# Patient Record
Sex: Female | Born: 1960
Health system: Southern US, Academic
[De-identification: ages and names within clinical notes are randomized; demographics above are authoritative.]

## PROBLEM LIST (undated history)

## (undated) ENCOUNTER — Encounter

## (undated) ENCOUNTER — Encounter: Attending: Gynecologic Oncology | Primary: Gynecologic Oncology

## (undated) ENCOUNTER — Ambulatory Visit: Payer: PRIVATE HEALTH INSURANCE

## (undated) ENCOUNTER — Encounter: Attending: "Endocrinology | Primary: "Endocrinology

## (undated) ENCOUNTER — Telehealth

## (undated) ENCOUNTER — Non-Acute Institutional Stay: Payer: PRIVATE HEALTH INSURANCE

## (undated) ENCOUNTER — Encounter: Attending: Adult Health | Primary: Adult Health

## (undated) ENCOUNTER — Encounter: Payer: PRIVATE HEALTH INSURANCE | Attending: Gynecologic Oncology | Primary: Gynecologic Oncology

## (undated) ENCOUNTER — Ambulatory Visit: Payer: PRIVATE HEALTH INSURANCE | Attending: Gynecologic Oncology | Primary: Gynecologic Oncology

## (undated) ENCOUNTER — Encounter
Attending: Student in an Organized Health Care Education/Training Program | Primary: Student in an Organized Health Care Education/Training Program

## (undated) ENCOUNTER — Ambulatory Visit

## (undated) ENCOUNTER — Telehealth
Attending: Student in an Organized Health Care Education/Training Program | Primary: Student in an Organized Health Care Education/Training Program

## (undated) DIAGNOSIS — C569 Malignant neoplasm of unspecified ovary: Secondary | ICD-10-CM

## (undated) DIAGNOSIS — J45901 Unspecified asthma with (acute) exacerbation: Secondary | ICD-10-CM

## (undated) DIAGNOSIS — G43909 Migraine, unspecified, not intractable, without status migrainosus: Secondary | ICD-10-CM

## (undated) HISTORY — PX: LIPOMA EXCISION: SHX5283

## (undated) HISTORY — DX: Unspecified asthma with (acute) exacerbation: J45.901

## (undated) HISTORY — DX: Migraine, unspecified, not intractable, without status migrainosus: G43.909

## (undated) HISTORY — PX: ABDOMINAL HYSTERECTOMY: SHX81

---
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
## 1998-11-20 ENCOUNTER — Other Ambulatory Visit: Admission: RE | Admit: 1998-11-20 | Discharge: 1998-11-20 | Payer: Self-pay | Admitting: Obstetrics and Gynecology

## 1999-12-31 ENCOUNTER — Other Ambulatory Visit: Admission: RE | Admit: 1999-12-31 | Discharge: 1999-12-31 | Payer: Self-pay | Admitting: *Deleted

## 2001-09-08 ENCOUNTER — Encounter: Payer: Self-pay | Admitting: Family Medicine

## 2001-09-08 ENCOUNTER — Ambulatory Visit (HOSPITAL_COMMUNITY): Admission: RE | Admit: 2001-09-08 | Discharge: 2001-09-08 | Payer: Self-pay | Admitting: Family Medicine

## 2003-02-09 ENCOUNTER — Encounter: Payer: Self-pay | Admitting: *Deleted

## 2003-02-09 ENCOUNTER — Ambulatory Visit (HOSPITAL_COMMUNITY): Admission: RE | Admit: 2003-02-09 | Discharge: 2003-02-09 | Payer: Self-pay | Admitting: *Deleted

## 2004-04-11 ENCOUNTER — Ambulatory Visit (HOSPITAL_COMMUNITY): Admission: RE | Admit: 2004-04-11 | Discharge: 2004-04-11 | Payer: Self-pay | Admitting: Family Medicine

## 2006-09-09 ENCOUNTER — Ambulatory Visit (HOSPITAL_COMMUNITY): Admission: RE | Admit: 2006-09-09 | Discharge: 2006-09-09 | Payer: Self-pay | Admitting: Family Medicine

## 2006-09-21 ENCOUNTER — Encounter: Admission: RE | Admit: 2006-09-21 | Discharge: 2006-09-21 | Payer: Self-pay | Admitting: Family Medicine

## 2008-03-14 ENCOUNTER — Ambulatory Visit (HOSPITAL_COMMUNITY): Admission: RE | Admit: 2008-03-14 | Discharge: 2008-03-14 | Payer: Self-pay | Admitting: Internal Medicine

## 2009-09-25 ENCOUNTER — Ambulatory Visit (HOSPITAL_COMMUNITY): Admission: RE | Admit: 2009-09-25 | Discharge: 2009-09-25 | Payer: Self-pay | Admitting: Internal Medicine

## 2010-11-07 ENCOUNTER — Ambulatory Visit (HOSPITAL_COMMUNITY)
Admission: RE | Admit: 2010-11-07 | Discharge: 2010-11-07 | Payer: Self-pay | Source: Home / Self Care | Attending: Internal Medicine | Admitting: Internal Medicine

## 2010-11-30 ENCOUNTER — Encounter: Payer: Self-pay | Admitting: Family Medicine

## 2010-11-30 ENCOUNTER — Encounter: Payer: Self-pay | Admitting: Internal Medicine

## 2011-08-19 ENCOUNTER — Other Ambulatory Visit (HOSPITAL_COMMUNITY): Payer: Self-pay | Admitting: Family Medicine

## 2011-08-19 DIAGNOSIS — Z1231 Encounter for screening mammogram for malignant neoplasm of breast: Secondary | ICD-10-CM

## 2011-11-16 ENCOUNTER — Ambulatory Visit (HOSPITAL_COMMUNITY)
Admission: RE | Admit: 2011-11-16 | Discharge: 2011-11-16 | Disposition: A | Discharge status: Home / Self Care | Payer: Managed Care, Other (non HMO) | Source: Ambulatory Visit | Attending: Family Medicine | Admitting: Family Medicine

## 2011-11-16 DIAGNOSIS — Z1231 Encounter for screening mammogram for malignant neoplasm of breast: Secondary | ICD-10-CM | POA: Insufficient documentation

## 2012-02-18 ENCOUNTER — Encounter (INDEPENDENT_AMBULATORY_CARE_PROVIDER_SITE_OTHER): Payer: Self-pay | Admitting: General Surgery

## 2012-02-18 ENCOUNTER — Ambulatory Visit (INDEPENDENT_AMBULATORY_CARE_PROVIDER_SITE_OTHER): Payer: Managed Care, Other (non HMO) | Admitting: General Surgery

## 2012-02-18 VITALS — BP 140/84 | HR 76 | Temp 98.0°F | Resp 16 | Ht 63.0 in | Wt 182.8 lb

## 2012-02-18 DIAGNOSIS — D235 Other benign neoplasm of skin of trunk: Secondary | ICD-10-CM | POA: Insufficient documentation

## 2012-02-18 NOTE — Patient Instructions (Signed)
We will schedule your procedure today.

## 2012-02-18 NOTE — Progress Notes (Addendum)
Patient ID: Mallory Huber, female   DOB: [birth year], 51 y.o.   MRN: [id number]  Chief Complaint  Patient presents with  . Lipoma    est pt- eval lipoma on back    HPI Mallory Huber is a 51 y.o. female.   HPI  She is self referred for evaluation of a soft tissue mass in the left upper back that is painful.  She has known about the mass for years but recently it has become painful-dull pain.  She is here to have it evaluated.  No trauma to the area.  No bleeding or infection.  She was told by one of her PCPs in the past that it may be a lipoma.  It has not grown to her knowledge.  Past Medical History  Diagnosis Date  . Lipoma     on back    Past Surgical History  Procedure Date  . Cesarean section 86, 89, 90    History reviewed. No pertinent family history.  Social History History  Substance Use Topics  . Smoking status: Never Smoker   . Smokeless tobacco: Not on file  . Alcohol Use: Yes    No Known Allergies  Current Outpatient Prescriptions  Medication Sig Dispense Refill  . Calcium Carbonate-Vitamin D (CALCIUM + D PO) Take by mouth daily.      . fluticasone (FLONASE) 50 MCG/ACT nasal spray Ad lib.      Stephanie Acre (GLUCOSAMINE MSM COMPLEX PO) Take by mouth daily.      . Multiple Vitamin (MULTIVITAMIN) capsule Take 1 capsule by mouth daily.        Review of Systems Review of Systems  Constitutional: Negative.   Respiratory: Negative.   Cardiovascular: Negative.   Musculoskeletal:       Pain in right scapular region where soft tissue mass is.    Blood pressure 140/84, pulse 76, temperature 98 F (36.7 C), temperature source Temporal, resp. rate 16, height 5\' 3"  (1.6 m), weight 182 lb 12.8 oz (82.918 kg).  Physical Exam Physical Exam  Constitutional: She appears well-developed and well-nourished. No distress.  Musculoskeletal:       5 cm by 5 cm deep soft tissue mass in right scapular area.  Good ROM of RUE.    Data  Reviewed None  Assessment    Symptomatic soft tissue mass of the left back of unknown character.    Plan    Excision of the mass.  The procedure, aftercare, and risks were discussed with there.  The risks include but are not limited to bleeding, infection, wound healing problems, anesthesia.  She seems to understand and agrees with the plan       Irlene Crudup J 02/18/2012, 1:42 PM

## 2012-04-29 DIAGNOSIS — D1739 Benign lipomatous neoplasm of skin and subcutaneous tissue of other sites: Secondary | ICD-10-CM

## 2012-11-29 ENCOUNTER — Other Ambulatory Visit (HOSPITAL_COMMUNITY): Payer: Self-pay | Admitting: Family Medicine

## 2012-11-29 DIAGNOSIS — Z1231 Encounter for screening mammogram for malignant neoplasm of breast: Secondary | ICD-10-CM

## 2012-12-12 ENCOUNTER — Ambulatory Visit (HOSPITAL_COMMUNITY)
Admission: RE | Admit: 2012-12-12 | Discharge: 2012-12-12 | Disposition: A | Discharge status: Home / Self Care | Payer: Managed Care, Other (non HMO) | Source: Ambulatory Visit | Attending: Family Medicine | Admitting: Family Medicine

## 2012-12-12 DIAGNOSIS — Z1231 Encounter for screening mammogram for malignant neoplasm of breast: Secondary | ICD-10-CM | POA: Insufficient documentation

## 2013-06-20 ENCOUNTER — Telehealth: Payer: Self-pay | Admitting: *Deleted

## 2013-06-27 NOTE — Telephone Encounter (Signed)
New pt appt.

## 2013-08-14 ENCOUNTER — Ambulatory Visit (INDEPENDENT_AMBULATORY_CARE_PROVIDER_SITE_OTHER): Payer: Managed Care, Other (non HMO) | Admitting: Internal Medicine

## 2013-08-14 ENCOUNTER — Encounter: Payer: Self-pay | Admitting: Internal Medicine

## 2013-08-14 ENCOUNTER — Encounter: Payer: Self-pay | Admitting: *Deleted

## 2013-08-14 VITALS — BP 119/72 | HR 66 | Temp 97.2°F | Resp 18 | Ht 63.0 in | Wt 159.0 lb

## 2013-08-14 DIAGNOSIS — E559 Vitamin D deficiency, unspecified: Secondary | ICD-10-CM

## 2013-08-14 DIAGNOSIS — Z8619 Personal history of other infectious and parasitic diseases: Secondary | ICD-10-CM

## 2013-08-14 DIAGNOSIS — M899 Disorder of bone, unspecified: Secondary | ICD-10-CM

## 2013-08-14 DIAGNOSIS — Z9189 Other specified personal risk factors, not elsewhere classified: Secondary | ICD-10-CM

## 2013-08-14 DIAGNOSIS — K8020 Calculus of gallbladder without cholecystitis without obstruction: Secondary | ICD-10-CM | POA: Insufficient documentation

## 2013-08-14 DIAGNOSIS — Z9289 Personal history of other medical treatment: Secondary | ICD-10-CM

## 2013-08-14 DIAGNOSIS — J309 Allergic rhinitis, unspecified: Secondary | ICD-10-CM

## 2013-08-14 DIAGNOSIS — J302 Other seasonal allergic rhinitis: Secondary | ICD-10-CM

## 2013-08-14 DIAGNOSIS — J45990 Exercise induced bronchospasm: Secondary | ICD-10-CM

## 2013-08-14 DIAGNOSIS — Z8669 Personal history of other diseases of the nervous system and sense organs: Secondary | ICD-10-CM

## 2013-08-14 DIAGNOSIS — M8580 Other specified disorders of bone density and structure, unspecified site: Secondary | ICD-10-CM

## 2013-08-14 LAB — CBC WITH DIFFERENTIAL/PLATELET
Basophils Relative: 1 % (ref 0–1)
Eosinophils Absolute: 0.2 10*3/uL (ref 0.0–0.7)
HCT: 33.1 % — ABNORMAL LOW (ref 36.0–46.0)
Hemoglobin: 11.2 g/dL — ABNORMAL LOW (ref 12.0–15.0)
MCH: 30.4 pg (ref 26.0–34.0)
MCHC: 33.8 g/dL (ref 30.0–36.0)
MCV: 89.7 fL (ref 78.0–100.0)
Monocytes Absolute: 0.3 10*3/uL (ref 0.1–1.0)
Monocytes Relative: 6 % (ref 3–12)

## 2013-08-14 LAB — LIPID PANEL
Cholesterol: 152 mg/dL (ref 0–200)
HDL: 65 mg/dL (ref >39)
LDL Cholesterol: 78 mg/dL (ref 0–99)
Triglycerides: 46 mg/dL (ref <150)
VLDL: 9 mg/dL (ref 0–40)

## 2013-08-14 LAB — COMPREHENSIVE METABOLIC PANEL
Albumin: 4.2 g/dL (ref 3.5–5.2)
Alkaline Phosphatase: 54 U/L (ref 39–117)
BUN: 15 mg/dL (ref 6–23)
Glucose, Bld: 80 mg/dL (ref 70–99)
Total Bilirubin: 0.6 mg/dL (ref 0.3–1.2)

## 2013-08-14 MED ORDER — FLUTICASONE PROPIONATE 50 MCG/ACT NA SUSP: NASAL | Status: DC

## 2013-08-14 NOTE — Patient Instructions (Addendum)
Activate My chart  Schedule CPE

## 2013-08-14 NOTE — Progress Notes (Signed)
Subjective:    Patient ID: Mallory Huber, female    DOB: 09-21-1961, 52 y.o.   MRN: [id number]  HPI Gindlesperger is a new pt here for first visit establish care.    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 And prior menses 4 months previous.     Overall doing well.  Had some positional dizziness 4 weeks ago but now resolved.  "Felt like bed spinning"   When she would lie down.    See scanned labs from 08/29/2012  She is using fluticasone for her allergies  No Known Allergies Past Medical History  Diagnosis Date  . Lipoma     on back   Past Surgical History  Procedure Laterality Date  . Cesarean section  86, 89, 90   History   Social History  . Marital Status: Married    Spouse Name: N/A    Number of Children: N/A  . Years of Education: N/A   Occupational History  . Not on file.   Social History Main Topics  . Smoking status: Never Smoker   . Smokeless tobacco: Not on file  . Alcohol Use: Yes  . Drug Use: No  . Sexual Activity:    Other Topics Concern  . Not on file   Social History Narrative  . No narrative on file   No family history on file. Patient Active Problem List   Diagnosis Date Noted  . Benign neoplasm of skin of trunk, except scrotum-right upper back 02/18/2012   Current Outpatient Prescriptions on File Prior to Visit  Medication Sig Dispense Refill  . Calcium Carbonate-Vitamin D (CALCIUM + D PO) Take by mouth daily.      . fluticasone (FLONASE) 50 MCG/ACT nasal spray Ad lib.      Stephanie Acre (GLUCOSAMINE MSM COMPLEX PO) Take by mouth daily.      . Multiple Vitamin (MULTIVITAMIN) capsule Take 1 capsule by mouth daily.       No current facility-administered medications on file prior to visit.       Review of Systems See HPI     Objective:   Physical Exam  Physical Exam  Nursing note and vitals reviewed.  Constitutional: She is oriented to person, place, and time. She appears well-developed and well-nourished.  HENT:  Head: Normocephalic and atraumatic.  Cardiovascular: Normal rate and regular rhythm. Exam reveals no gallop and no friction rub.  No murmur heard.  Pulmonary/Chest: Breath sounds normal. She has no wheezes. She has no rales.  Neurological: She is alert and oriented to person, place, and time.  CNII-xii intact Motor  5/5 Ue and LE REflexes  2+ symmetric Cerebellar  Intact FTN Sensor intact to touch Skin: Skin is warm and dry.  Psychiatric: She has a normal mood and affect. Her behavior is normal.            Assessment & Plan:  Allergic rhinitis  Will re-order flonase  Dizziness:    Clinical picture consistant with BPV   Neurologic exam non focal.  Discussed vestibular rehab and Epley maneuver.   If recurs she is to see me in office.  She does  Not wish to see rehab at this point as sympotms have resolved.    Vitamin D deficiency will obtain today  Exercise induced asthma  Continue prn albuterol  History of migraines  Prn NSAId for ne  cholelihiasis

## 2013-08-16 ENCOUNTER — Encounter: Payer: Self-pay | Admitting: Internal Medicine

## 2013-08-16 ENCOUNTER — Telehealth: Payer: Self-pay | Admitting: Internal Medicine

## 2013-08-16 MED ORDER — INTEGRA 62.5-62.5-40-3 MG PO CAPS: ORAL_CAPSULE | ORAL | Status: DC

## 2013-08-16 NOTE — Telephone Encounter (Signed)
Left voicemail at home and work for pt to call office regarding lab results

## 2013-08-16 NOTE — Telephone Encounter (Signed)
Spoke with pt.  And informed of slight anemia and slightly eleveated liver enzyme  Will take Integra daily and she will see me in December for repeat liver enzymes

## 2013-08-24 ENCOUNTER — Encounter: Payer: Self-pay | Admitting: Internal Medicine

## 2013-09-14 ENCOUNTER — Other Ambulatory Visit: Payer: Self-pay | Admitting: Internal Medicine

## 2013-09-18 ENCOUNTER — Encounter: Payer: Self-pay | Admitting: Internal Medicine

## 2013-09-20 ENCOUNTER — Other Ambulatory Visit: Payer: Self-pay | Admitting: *Deleted

## 2013-09-20 ENCOUNTER — Telehealth: Payer: Self-pay | Admitting: *Deleted

## 2013-09-20 NOTE — Telephone Encounter (Signed)
Pt needs these medications refilled and would like available refills at pharmacy.  [facility] on [address]  fluticasone (FLONASE) 50 MCG/ACT nasal spray   acyclovir (ZOVIRAX) 400 MG tablet  albuterol (PROVENTIL HFA;VENTOLIN HFA) 108 (90 BASE) MCG/ACT inhaler

## 2013-09-20 NOTE — Telephone Encounter (Signed)
Returned pt call regarding left hip pain pt wanted referral to sports medicine gave her # to Dr Pearletha Forge

## 2013-09-22 ENCOUNTER — Ambulatory Visit (INDEPENDENT_AMBULATORY_CARE_PROVIDER_SITE_OTHER): Payer: Managed Care, Other (non HMO) | Admitting: Family Medicine

## 2013-09-22 ENCOUNTER — Other Ambulatory Visit: Payer: Self-pay | Admitting: *Deleted

## 2013-09-22 VITALS — BP 167/95 | HR 67 | Ht 63.0 in | Wt 153.0 lb

## 2013-09-22 DIAGNOSIS — M25552 Pain in left hip: Secondary | ICD-10-CM

## 2013-09-22 DIAGNOSIS — D649 Anemia, unspecified: Secondary | ICD-10-CM

## 2013-09-22 DIAGNOSIS — M25559 Pain in unspecified hip: Secondary | ICD-10-CM

## 2013-09-22 DIAGNOSIS — R748 Abnormal levels of other serum enzymes: Secondary | ICD-10-CM

## 2013-09-22 MED ORDER — FLUTICASONE PROPIONATE 50 MCG/ACT NA SUSP: NASAL | Status: DC

## 2013-09-22 MED ORDER — ALBUTEROL SULFATE HFA 108 (90 BASE) MCG/ACT IN AERS: 2.0000 | INHALATION_SPRAY | Freq: Four times a day (QID) | RESPIRATORY_TRACT | Status: DC | PRN

## 2013-09-22 MED ORDER — ACYCLOVIR 400 MG PO TABS: 400.0000 mg | ORAL_TABLET | Freq: Two times a day (BID) | ORAL | Status: DC

## 2013-09-22 NOTE — Telephone Encounter (Signed)
Refill per Dr Constance Goltz VO sent to [facility] and called in

## 2013-09-22 NOTE — Patient Instructions (Signed)
You have elements of both piriformis syndrome and proximal hamstring tendinopathy. Start home exercises 3 sets of 10 once a day for next 6 weeks. Stretches - pick 2-3 of these, hold for 20-30 seconds and repeat 3 times once a day. Ok to continue running as long as not limping and pain is not more than a 3 on a scale of 1-10. Consider compression shorts. Ice or heat 15 minutes at a time 3-4 times a day. Tennis ball massage. Consider formal physical therapy if not improving as expected. Follow up with me in 5 weeks for reevaluation.

## 2013-09-26 ENCOUNTER — Encounter: Payer: Self-pay | Admitting: Family Medicine

## 2013-09-26 DIAGNOSIS — M25552 Pain in left hip: Secondary | ICD-10-CM | POA: Insufficient documentation

## 2013-09-26 NOTE — Assessment & Plan Note (Signed)
consistent with piriformis syndrome, proximal hamstring tendinopathy.  Start home exercises, stretches which were given today.  Heat as needed, compression shorts, tennis ball massage.  Consider formal physical therapy if not improving as expected.  F/u in 5 weeks.

## 2013-09-26 NOTE — Progress Notes (Signed)
Patient ID: Mallory Huber, female   DOB: [birth year], 52 y.o.   MRN: [id number]  PCP: Levon Hedger, MD  Subjective:   HPI: Patient is a 52 y.o. female here for left hip pain.  Patient deneies known injury. States she sits a lot at work. Pain going on for about 2 months but past 2 weeks have been worse. Has posterior left buttock/hip pain that radiates down leg. Not worse with or after running. No numbness, tingling. Able to run half marathon recently. No bowel/bladder dysfunction  Past Medical History  Diagnosis Date  . Lipoma     on back  . Migraine   . Asthma with exacerbation     Current Outpatient Prescriptions on File Prior to Visit  Medication Sig Dispense Refill  . Calcium Carbonate-Vitamin D (CALCIUM + D PO) Take by mouth daily.      . Fe Fum-FePoly-Vit C-Vit B3 (INTEGRA) 62.5-62.5-40-3 MG CAPS Take one daily  30 capsule  3  . fexofenadine (ALLEGRA) 30 MG tablet Take 30 mg by mouth daily.      . fish oil-omega-3 fatty acids 1000 MG capsule Take 1,200 mg by mouth daily.      Stephanie Acre (GLUCOSAMINE MSM COMPLEX PO) Take by mouth daily.      . Melatonin 5 MG TABS Take 5 mg by mouth at bedtime as needed.      . Multiple Vitamin (MULTIVITAMIN) capsule Take 1 capsule by mouth daily.      . Vitamin D, Ergocalciferol, (DRISDOL) 50000 UNITS CAPS capsule Take 50,000 Units by mouth every 7 (seven) days.       No current facility-administered medications on file prior to visit.    Past Surgical History  Procedure Laterality Date  . Cesarean section  86, 89, 90  . Lipoma excision      No Known Allergies  History   Social History  . Marital Status: Married    Spouse Name: N/A    Number of Children: N/A  . Years of Education: N/A   Occupational History  . Not on file.   Social History Main Topics  . Smoking status: Never Smoker   . Smokeless tobacco: Never Used  . Alcohol Use: Yes  . Drug Use: No  . Sexual Activity: Yes    Birth  Control/ Protection: Surgical   Other Topics Concern  . Not on file   Social History Narrative  . No narrative on file    Family History  Problem Relation Age of Onset  . Hypertension Mother   . Hypertension Father   . Cancer Maternal Aunt     breast  . Hyperlipidemia Neg Hx   . Heart attack Neg Hx   . Diabetes Neg Hx   . Sudden death Neg Hx     BP 167/95  Pulse 67  Ht 5\' 3"  (1.6 m)  Wt 153 lb (69.4 kg)  BMI 27.11 kg/m2  Review of Systems: See HPI above.    Objective:  Physical Exam:  Gen: NAD  Back/L hip: No gross deformity, scoliosis. TTP mildly piriformis and proximal hamstring.  No midline or bony TTP. FROM back and hip without pain on passive ROM. Strength LEs 5/5 all muscle groups.  Minimal pain with hamstring curl at 90 degrees. 2+ MSRs in patellar and achilles tendons, equal bilaterally. Negative SLRs. Sensation intact to light touch bilaterally. Negative logroll bilateral hips Mild positive piriformis left.  Negative fabers and right piriformis stretches.    Assessment & Plan:  1. Left hip  pain - consistent with piriformis syndrome, proximal hamstring tendinopathy.  Start home exercises, stretches which were given today.  Heat as needed, compression shorts, tennis ball massage.  Consider formal physical therapy if not improving as expected.  F/u in 5 weeks.

## 2013-10-16 ENCOUNTER — Ambulatory Visit: Payer: Managed Care, Other (non HMO) | Admitting: Internal Medicine

## 2013-10-18 ENCOUNTER — Ambulatory Visit: Payer: Managed Care, Other (non HMO) | Admitting: Internal Medicine

## 2013-10-19 ENCOUNTER — Ambulatory Visit: Payer: Managed Care, Other (non HMO) | Admitting: Family Medicine

## 2013-10-30 ENCOUNTER — Emergency Department (HOSPITAL_BASED_OUTPATIENT_CLINIC_OR_DEPARTMENT_OTHER)
Admission: EM | Admit: 2013-10-30 | Discharge: 2013-10-30 | Disposition: A | Discharge status: Home / Self Care | Payer: Managed Care, Other (non HMO) | Attending: Emergency Medicine | Admitting: Emergency Medicine

## 2013-10-30 ENCOUNTER — Encounter (HOSPITAL_BASED_OUTPATIENT_CLINIC_OR_DEPARTMENT_OTHER): Payer: Self-pay | Admitting: Emergency Medicine

## 2013-10-30 ENCOUNTER — Emergency Department (HOSPITAL_BASED_OUTPATIENT_CLINIC_OR_DEPARTMENT_OTHER): Payer: Managed Care, Other (non HMO)

## 2013-10-30 DIAGNOSIS — Z8679 Personal history of other diseases of the circulatory system: Secondary | ICD-10-CM | POA: Insufficient documentation

## 2013-10-30 DIAGNOSIS — Z79899 Other long term (current) drug therapy: Secondary | ICD-10-CM | POA: Insufficient documentation

## 2013-10-30 DIAGNOSIS — Z862 Personal history of diseases of the blood and blood-forming organs and certain disorders involving the immune mechanism: Secondary | ICD-10-CM | POA: Insufficient documentation

## 2013-10-30 DIAGNOSIS — R142 Eructation: Secondary | ICD-10-CM | POA: Insufficient documentation

## 2013-10-30 DIAGNOSIS — J45909 Unspecified asthma, uncomplicated: Secondary | ICD-10-CM | POA: Insufficient documentation

## 2013-10-30 DIAGNOSIS — IMO0002 Reserved for concepts with insufficient information to code with codable children: Secondary | ICD-10-CM | POA: Insufficient documentation

## 2013-10-30 DIAGNOSIS — R141 Gas pain: Secondary | ICD-10-CM | POA: Insufficient documentation

## 2013-10-30 DIAGNOSIS — K8020 Calculus of gallbladder without cholecystitis without obstruction: Secondary | ICD-10-CM | POA: Insufficient documentation

## 2013-10-30 DIAGNOSIS — Z8639 Personal history of other endocrine, nutritional and metabolic disease: Secondary | ICD-10-CM | POA: Insufficient documentation

## 2013-10-30 LAB — COMPREHENSIVE METABOLIC PANEL
AST: 37 U/L (ref 0–37)
BUN: 10 mg/dL (ref 6–23)
CO2: 27 mEq/L (ref 19–32)
Calcium: 8.9 mg/dL (ref 8.4–10.5)
Chloride: 101 mEq/L (ref 96–112)
Creatinine, Ser: 0.7 mg/dL (ref 0.50–1.10)
GFR calc Af Amer: >90 mL/min (ref >90)
GFR calc non Af Amer: >90 mL/min (ref >90)
Glucose, Bld: 95 mg/dL (ref 70–99)
Total Bilirubin: 0.3 mg/dL (ref 0.3–1.2)

## 2013-10-30 LAB — CBC WITH DIFFERENTIAL/PLATELET
Basophils Absolute: 0 10*3/uL (ref 0.0–0.1)
Eosinophils Relative: 2 % (ref 0–5)
HCT: 37.3 % (ref 36.0–46.0)
Hemoglobin: 12.3 g/dL (ref 12.0–15.0)
Lymphocytes Relative: 15 % (ref 12–46)
MCV: 94.2 fL (ref 78.0–100.0)
Monocytes Absolute: 0.6 10*3/uL (ref 0.1–1.0)
Monocytes Relative: 7 % (ref 3–12)
RDW: 12.3 % (ref 11.5–15.5)
WBC: 9.2 10*3/uL (ref 4.0–10.5)

## 2013-10-30 LAB — URINE MICROSCOPIC-ADD ON

## 2013-10-30 LAB — URINALYSIS, ROUTINE W REFLEX MICROSCOPIC
Ketones, ur: NEGATIVE mg/dL
Nitrite: NEGATIVE
Protein, ur: NEGATIVE mg/dL
Urobilinogen, UA: 0.2 mg/dL (ref 0.0–1.0)

## 2013-10-30 LAB — LIPASE, BLOOD: Lipase: 21 U/L (ref 11–59)

## 2013-10-30 NOTE — ED Notes (Signed)
Pt c/o right upper abd pain with bloating x 3 days. LAst BM x 3 days ago

## 2013-10-30 NOTE — ED Provider Notes (Addendum)
CSN: [id number]     Arrival date & time 10/30/13  1507 History   First MD Initiated Contact with Patient 10/30/13 1536  This chart was scribed for Gwyneth Sprout, MD by Valera Castle, ED Scribe. This patient was seen in room MH04/MH04 and the patient's care was started at 3:37 PM.     Chief Complaint  Patient presents with  . Abdominal Pain   The history is provided by the patient. No language interpreter was used.   HPI Comments: Mallory Huber is a 52 y.o. female who presents to the Emergency Department complaining of sudden, moderate, intermittent, upper, right abdominal pain, that intermittently radiates to her right back, with associated distention, onset 3 days ago. She denies any current pain, just distention. She reports h/o abdomen scan and [place], which showed a blockage of her cystic duct. Pt states her abdominal pain had subsided soon after the imaging, so her doctor and her decided not to remove her gallbladder/duct. She states her current abdominal pain seems similar to her h/o abdominal pain before having the scan and [place]. She reports having successful bowel movements recently. She denies having eating today due to the pain, and only eating a little yesterday. She reports taking medication for her distention, without relief. She denies emesis, left abdominal pain, and any other associated symptoms.    PCP Levon Hedger, MD  Past Medical History  Diagnosis Date  . Lipoma     on back  . Migraine   . Asthma with exacerbation    Past Surgical History  Procedure Laterality Date  . Cesarean section  86, 89, 90  . Lipoma excision     Family History  Problem Relation Age of Onset  . Hypertension Mother   . Hypertension Father   . Cancer Maternal Aunt     breast  . Hyperlipidemia Neg Hx   . Heart attack Neg Hx   . Diabetes Neg Hx   . Sudden death Neg Hx    History  Substance Use Topics  . Smoking status: Never Smoker   . Smokeless tobacco: Never Used  . Alcohol  Use: Yes   OB History   Grav Para Term Preterm Abortions TAB SAB Ect Mult Living   4         3     Review of Systems A complete 10 system review of systems was obtained and all systems are negative except as noted in the HPI and PMH.   Allergies  Review of patient's allergies indicates no known allergies.  Home Medications   Current Outpatient Rx  Name  Route  Sig  Dispense  Refill  . acyclovir (ZOVIRAX) 400 MG tablet   Oral   Take 1 tablet (400 mg total) by mouth 2 (two) times daily. s needed for cold sores   60 tablet   3   . albuterol (PROVENTIL HFA;VENTOLIN HFA) 108 (90 BASE) MCG/ACT inhaler   Inhalation   Inhale 2 puffs into the lungs every 6 (six) hours as needed for wheezing.   1 Inhaler   1   . Calcium Carbonate-Vitamin D (CALCIUM + D PO)   Oral   Take by mouth daily.         . Fe Fum-FePoly-Vit C-Vit B3 (INTEGRA) 62.5-62.5-40-3 MG CAPS      Take one daily   30 capsule   3   . fexofenadine (ALLEGRA) 30 MG tablet   Oral   Take 30 mg by mouth daily.         Marland Kitchen  fish oil-omega-3 fatty acids 1000 MG capsule   Oral   Take 1,200 mg by mouth daily.         . fluticasone (FLONASE) 50 MCG/ACT nasal spray      Use one spray in each nostril daily for 2 weeks   16 g   0   . Glucos-MSM-C-Mn-Ginger-Willow (GLUCOSAMINE MSM COMPLEX PO)   Oral   Take by mouth daily.         . Melatonin 5 MG TABS   Oral   Take 5 mg by mouth at bedtime as needed.         . Multiple Vitamin (MULTIVITAMIN) capsule   Oral   Take 1 capsule by mouth daily.         . Vitamin D, Ergocalciferol, (DRISDOL) 50000 UNITS CAPS capsule   Oral   Take 50,000 Units by mouth every 7 (seven) days.          BP 147/80  Pulse 82  Temp(Src) 98.6 F (37 C)  Resp 16  Ht 5\' 3"  (1.6 m)  Wt 151 lb (68.493 kg)  BMI 26.76 kg/m2  SpO2 99%  LMP 10/23/2013  Physical Exam  Nursing note and vitals reviewed. Constitutional: She is oriented to person, place, and time. She appears  well-developed and well-nourished. No distress.  HENT:  Head: Normocephalic and atraumatic.  Eyes: EOM are normal.  Neck: Neck supple. No tracheal deviation present.  Cardiovascular: Normal rate.   Pulmonary/Chest: Effort normal. No respiratory distress.  Abdominal: Soft. There is tenderness in the right upper quadrant. There is positive Murphy's sign.  Musculoskeletal: Normal range of motion.  Neurological: She is alert and oriented to person, place, and time.  Skin: Skin is warm and dry.  Psychiatric: She has a normal mood and affect. Her behavior is normal.    ED Course  Procedures (including critical care time)  DIAGNOSTIC STUDIES: Oxygen Saturation is 99% on room air, normal by my interpretation.    COORDINATION OF CARE: 3:41 PM-Discussed treatment plan which includes [place], blood work with pt at bedside and pt agreed to plan.   Labs Review Labs Reviewed  CBC WITH DIFFERENTIAL - Abnormal; Notable for the following:    Platelets 408 (*)    All other components within normal limits  COMPREHENSIVE METABOLIC PANEL - Abnormal; Notable for the following:    ALT 52 (*)    All other components within normal limits  URINALYSIS, ROUTINE W REFLEX MICROSCOPIC - Abnormal; Notable for the following:    Leukocytes, UA SMALL (*)    All other components within normal limits  LIPASE, BLOOD  URINE MICROSCOPIC-ADD ON   Imaging Review US Abdomen Complete  10/30/2013   CLINICAL DATA:  Right upper quadrant pain  EXAM: ULTRASOUND ABDOMEN COMPLETE  COMPARISON:  None.  FINDINGS: Gallbladder:  Multiple gallstones are identified. Mild wall thickening is noted at 4 mm.  Common bile duct:  Diameter: 6 mm.  Liver:  No focal lesion is identified. Diffuse increased echogenicity is seen.  IVC:  No abnormality visualized.  Pancreas:  Visualized portion unremarkable.  Spleen:  Size and appearance within normal limits.  Right Kidney:  Length: 10.9 cm in length. Echogenicity is within normal limits. No mass  lesion or hydronephrosis is noted. .  Left Kidney:  Length: 10.7 cm in length. Echogenicity within normal limits. No mass or hydronephrosis visualized.  Abdominal aorta:  No aneurysm visualized.  Other findings:  Mild ascites is noted throughout the abdomen.  IMPRESSION: Mild ascites.  Cholelithiasis with mild wall thickening. The wall thickening may be related to the ascites however.   Electronically Signed   By: Alcide Clever M.D.   On: 10/30/2013 16:24    EKG Interpretation   None       MDM   1. Cholelithiases     Patient with a prior history of gallbladder pathology proven on HIDA scan approximately 2 years ago. However in the last 3-4 days patient has had worsening upper abdominal distention and right upper quadrant pain.  Nausea without vomiting and no fever. She is having normal bowel movements but states that every time she eats the pain worsens. When she was seeing Dr. Abbey Chatters during the testing 2 years ago her sx resolved and they decided to not remove the gallbladder however now the pain returned.  On exam she has RUQ pain and positive murphy's sign.  Nontoxic appearing and does not want anything for pain at this time.  CBC, CMP, lipase, UA pending.  [place] pending.  5:04 PM Labs wnl.  UA with cholelithiasis without cholecysitis at this time.  Will f/u with general surgery.  I personally performed the services described in this documentation, which was scribed in my presence.  The recorded information has been reviewed and considered.    Gwyneth Sprout, MD 10/30/13 1705  Gwyneth Sprout, MD 10/30/13 [phone]

## 2013-11-01 ENCOUNTER — Encounter (HOSPITAL_COMMUNITY): Payer: Self-pay | Admitting: Emergency Medicine

## 2013-11-01 ENCOUNTER — Emergency Department (HOSPITAL_COMMUNITY): Payer: Managed Care, Other (non HMO)

## 2013-11-01 ENCOUNTER — Emergency Department (HOSPITAL_COMMUNITY)
Admission: EM | Admit: 2013-11-01 | Discharge: 2013-11-01 | Disposition: A | Discharge status: Home / Self Care | Payer: Managed Care, Other (non HMO) | Attending: Emergency Medicine | Admitting: Emergency Medicine

## 2013-11-01 DIAGNOSIS — Z872 Personal history of diseases of the skin and subcutaneous tissue: Secondary | ICD-10-CM | POA: Insufficient documentation

## 2013-11-01 DIAGNOSIS — K8020 Calculus of gallbladder without cholecystitis without obstruction: Secondary | ICD-10-CM | POA: Insufficient documentation

## 2013-11-01 DIAGNOSIS — R109 Unspecified abdominal pain: Secondary | ICD-10-CM

## 2013-11-01 DIAGNOSIS — R188 Other ascites: Secondary | ICD-10-CM

## 2013-11-01 DIAGNOSIS — J45909 Unspecified asthma, uncomplicated: Secondary | ICD-10-CM | POA: Insufficient documentation

## 2013-11-01 DIAGNOSIS — Z79899 Other long term (current) drug therapy: Secondary | ICD-10-CM | POA: Insufficient documentation

## 2013-11-01 DIAGNOSIS — R1011 Right upper quadrant pain: Secondary | ICD-10-CM

## 2013-11-01 DIAGNOSIS — Z8679 Personal history of other diseases of the circulatory system: Secondary | ICD-10-CM | POA: Insufficient documentation

## 2013-11-01 DIAGNOSIS — IMO0002 Reserved for concepts with insufficient information to code with codable children: Secondary | ICD-10-CM | POA: Insufficient documentation

## 2013-11-01 LAB — URINALYSIS, ROUTINE W REFLEX MICROSCOPIC
Ketones, ur: NEGATIVE mg/dL
Nitrite: NEGATIVE
Specific Gravity, Urine: 1.025 (ref 1.005–1.030)
pH: 5.5 (ref 5.0–8.0)

## 2013-11-01 LAB — COMPREHENSIVE METABOLIC PANEL
Albumin: 3.2 g/dL — ABNORMAL LOW (ref 3.5–5.2)
Alkaline Phosphatase: 68 U/L (ref 39–117)
BUN: 14 mg/dL (ref 6–23)
Calcium: 8.7 mg/dL (ref 8.4–10.5)
Creatinine, Ser: 0.66 mg/dL (ref 0.50–1.10)
GFR calc non Af Amer: >90 mL/min (ref >90)
Glucose, Bld: 97 mg/dL (ref 70–99)
Sodium: 140 mEq/L (ref 135–145)
Total Protein: 6.3 g/dL (ref 6.0–8.3)

## 2013-11-01 LAB — LIPASE, BLOOD: Lipase: 17 U/L (ref 11–59)

## 2013-11-01 LAB — CBC
HCT: 36.6 % (ref 36.0–46.0)
MCHC: 33.6 g/dL (ref 30.0–36.0)
MCV: 93.1 fL (ref 78.0–100.0)
RDW: 12.9 % (ref 11.5–15.5)

## 2013-11-01 LAB — URINE MICROSCOPIC-ADD ON

## 2013-11-01 IMAGING — US US ABDOMEN LIMITED
1 series · 13 of 25 positions shown · non-contrast
Comparison: [DATE]

CLINICAL DATA: Abdominal pain, right upper quadrant pain

EXAM:
US ABDOMEN LIMITED - RIGHT UPPER QUADRANT

[Series 1: us abdomen limited · 0.24mm/px · 13 of 83 slices shown]
[im 1/83]
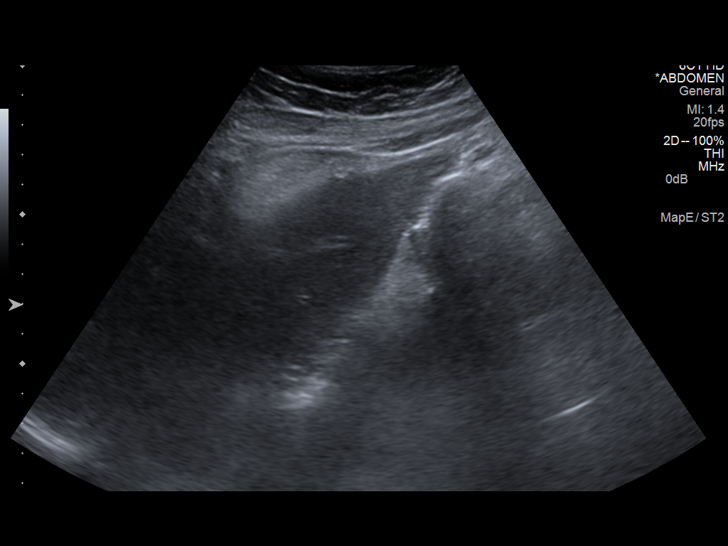
[im 7/83]
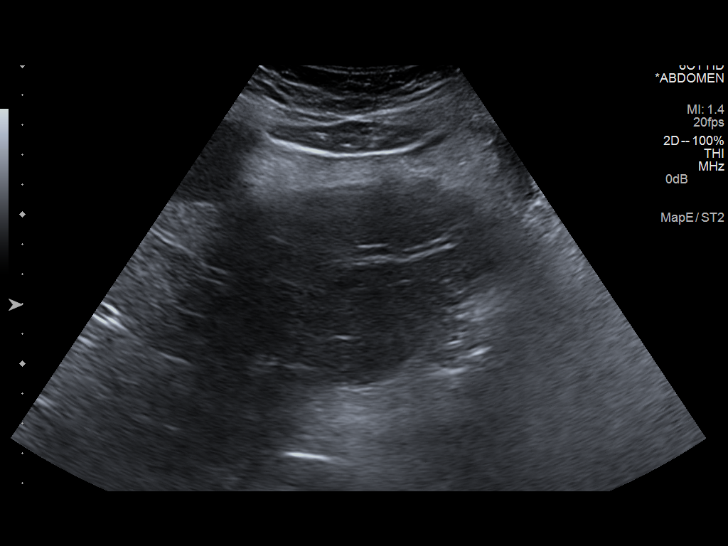
[im 14/83]
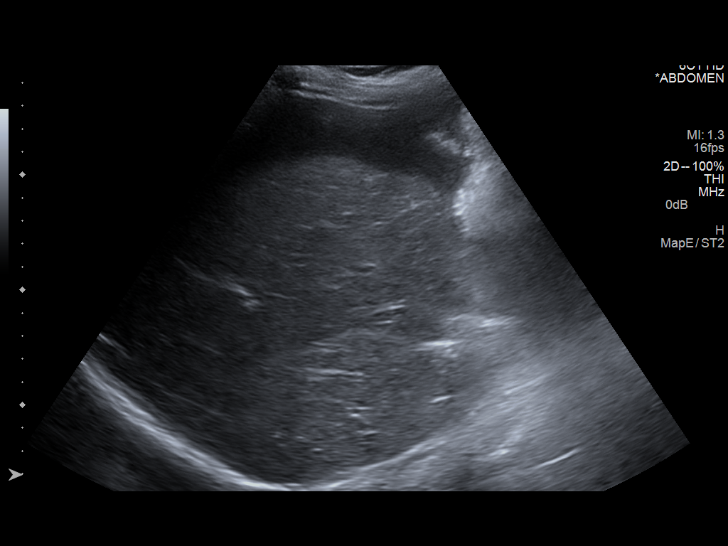
[im 21/83]
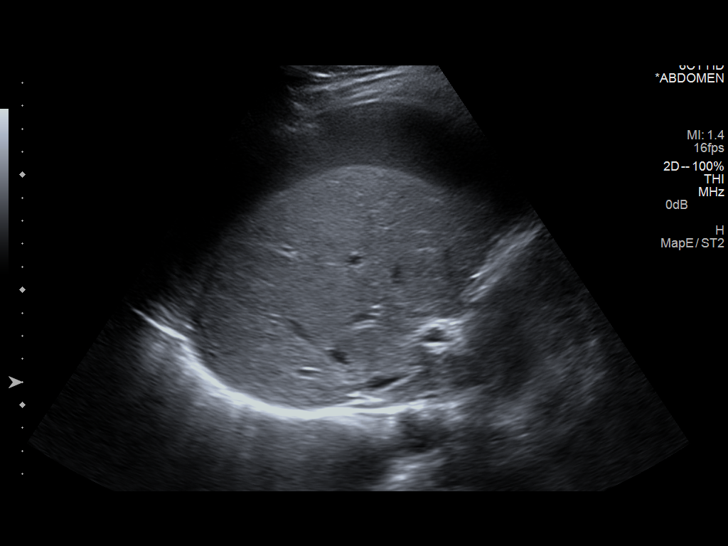
[im 28/83]
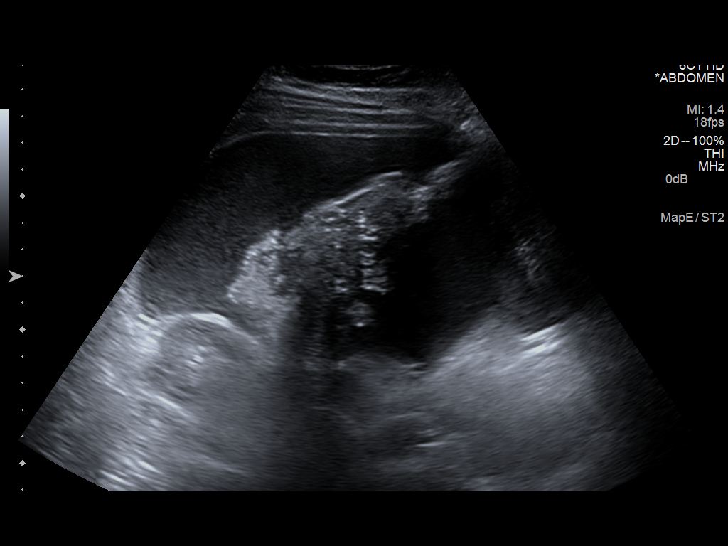
[im 35/83]
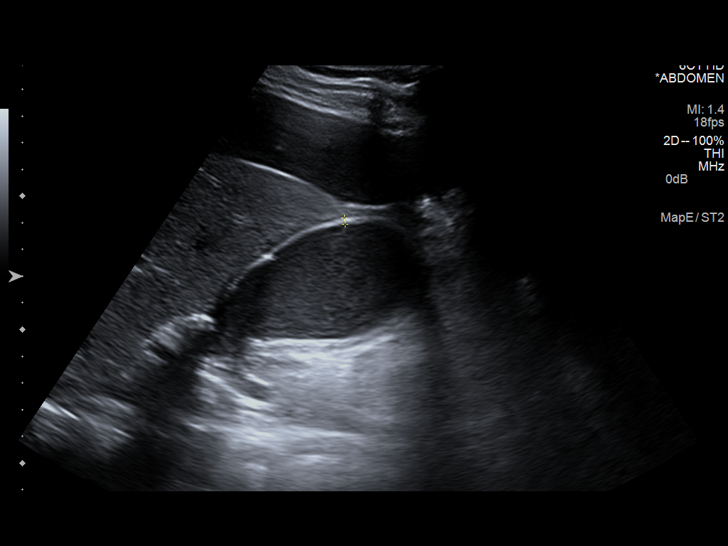
[im 42/83]
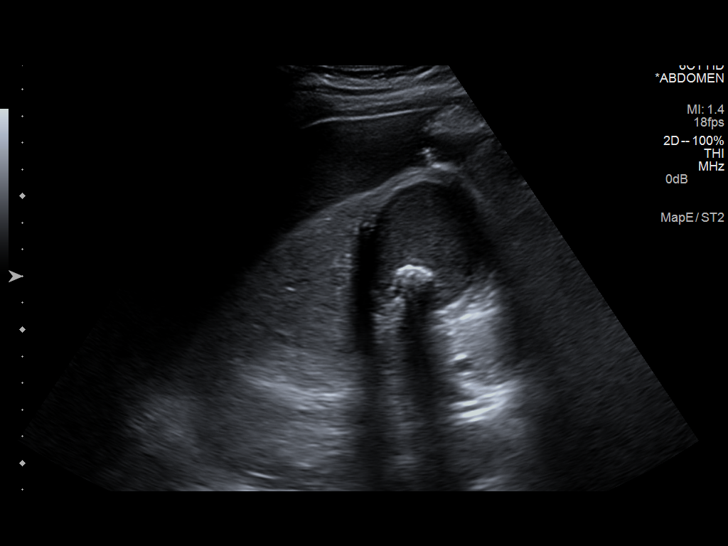
[im 48/83]
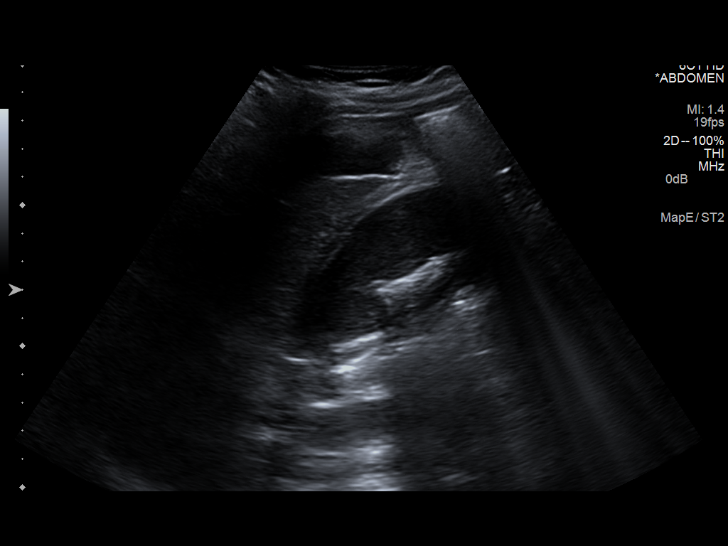
[im 55/83]
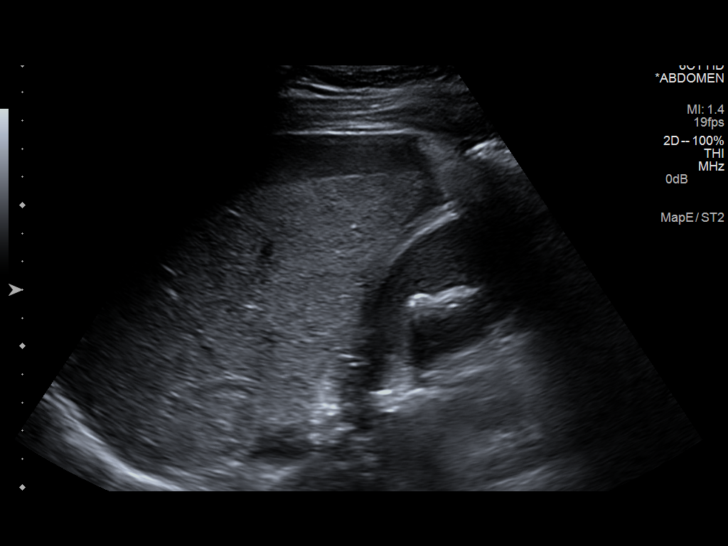
[im 62/83]
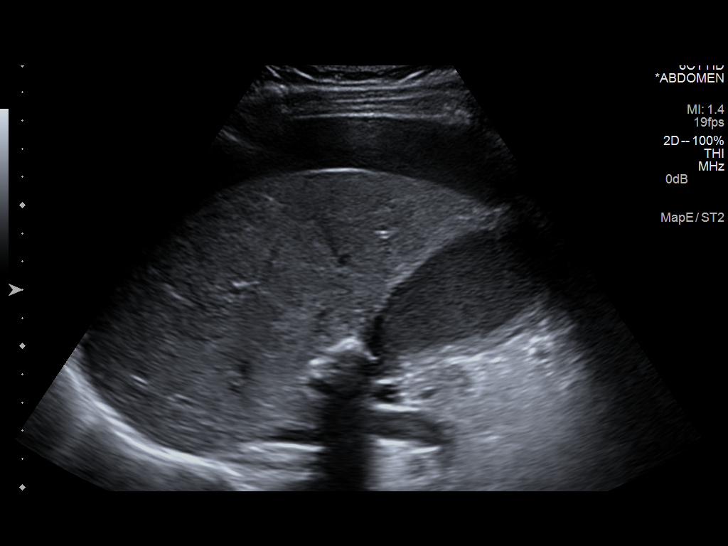
[im 69/83]
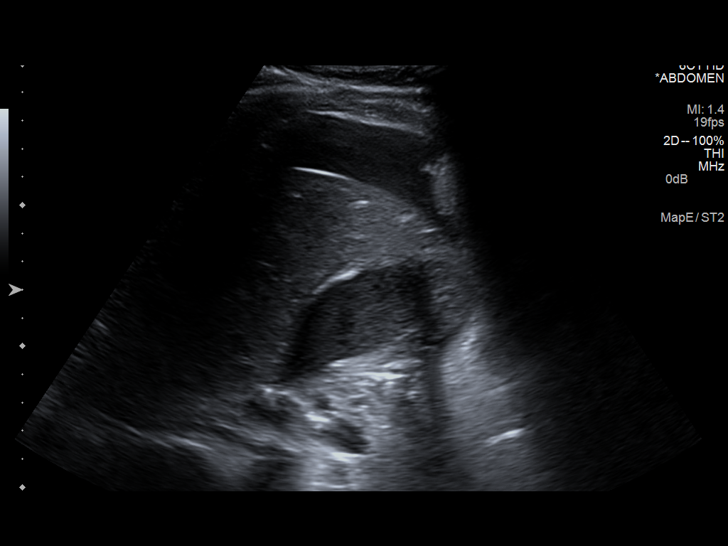
[im 76/83]
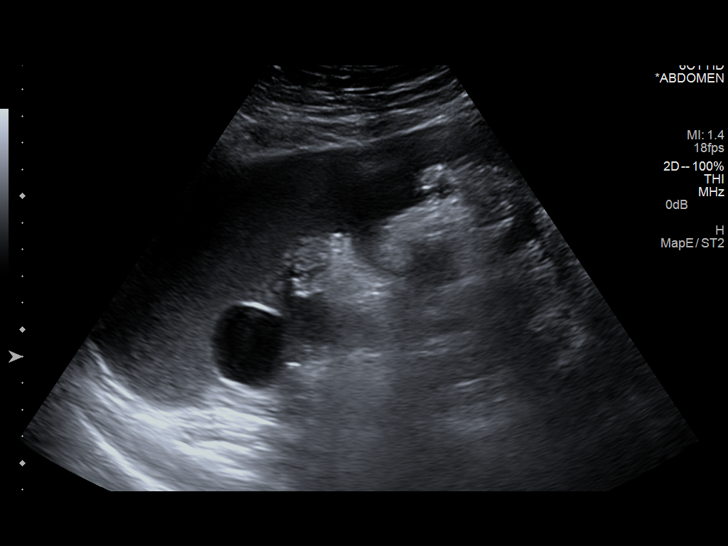
[im 83/83]
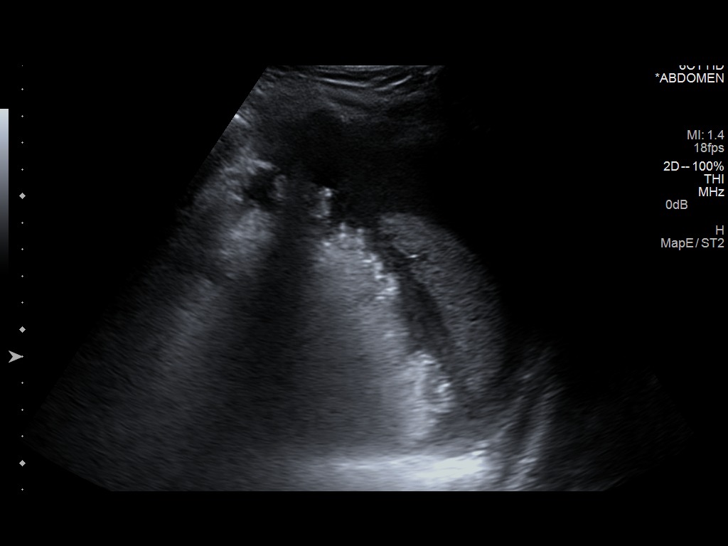

[13 of 25 positions shown; findings below may reference images not displayed]

FINDINGS: Gallbladder

Gallbladder wall thickening present up to 6 mm thick, nonspecific in
the setting of ascites. Multiple shadowing calculi within
gallbladder. Additional sludge within gallbladder. 20 mm gallstone
appears fixed in position of the gallbladder neck. No sonographic
Murphy sign.

Common bile duct

Diameter: Only a short segment visualized, 8 mm diameter at porta
hepatis

Liver:

Appear small in size without gross mass or nodularity. Hepatopetal
portal venous flow.

Other Large volume ascites.

Small right pleural effusion.

Cystic lesion identified in left lower quadrant, 3.0 x 3.4 x 2.8 cm,
uncertain etiology, could potentially be of left ovarian origin.
IMPRESSION: Sludge and multiple calculi within gallbladder including a 20 mm
gallstone fixed in position at the gallbladder neck.

Gallbladder wall thickening is nonspecific, could be related to
ascites or cholecystitis though no definite sonographic Murphy sign
identified.

Mild extrahepatic biliary dilatation with CBD 8 mm diameter,
recommend correlation with LFTs.

Significant ascites and small right pleural effusion.

Question left ovarian cyst.

## 2013-11-01 MED ORDER — HYDROCODONE-ACETAMINOPHEN 5-325 MG PO TABS: 2.0000 | ORAL_TABLET | ORAL | Status: DC | PRN

## 2013-11-01 NOTE — Consult Note (Signed)
Reason for Consult:cholelithiasis, abdominal pain, abdominal swelling and bloating Referring Physician: [name] is an 52 y.o. female.  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 She has a history of 4 years ago of an episode of abdominal pain which was evaluated with a hiatus scan showing cystic duct obstruction. She was evaluated for cholecystectomy at that time but was asymptomatic and surgery was not performed. She has essentially felt well until about one week ago. At that time she noted the onset of generalized abdominal bloating and pressure. About 3-4 days ago she had the onset of right upper quadrant abdominal pain. This has been intermittent. Both the abdominal pressure and swelling and right upper quadrant pain is worse with eating but the abdominal swelling is always present. She presented to the [facility] emergency Department 2 days ago and an abdominal ultrasound was performed showing cholelithiasis and some slight thickening of the gallbladder wall but also moderate ascites. She felt better after treatment and was referred for outpatient surgical evaluation. Her symptoms have continued for the last 2 days and she presented back to the emergency department today. Ultrasound was repeated which again shows cholelithiasis and a possible stone impacted in the neck of the gallbladder with some gallbladder wall thickening but fairly marked ascites. A small cyst is seen on her ovary. I was asked to see the patient. She is not having nausea or vomiting. She has been having diarrhea for about a week. No melena or hematochezia. She is perimenopausal without abnormal vaginal bleeding or discharge. No urinary symptoms. No fever or chills. She has no history of liver disease. On history today she states it is really be generalized abdominal swelling and pressure that is bothering her more than the  localized right upper quadrant discomfort.  Past Medical History  Diagnosis Date  . Lipoma     on back  . Migraine   . Asthma with exacerbation     Past Surgical History  Procedure Laterality Date  . Cesarean section  86, 89, 90  . Lipoma excision      Family History  Problem Relation Age of Onset  . Hypertension Mother   . Hypertension Father   . Cancer Maternal Aunt     breast  . Hyperlipidemia Neg Hx   . Heart attack Neg Hx   . Diabetes Neg Hx   . Sudden death Neg Hx     Social History:  reports that she has never smoked. She has never used smokeless tobacco. She reports that she drinks alcohol. She reports that she does not use illicit drugs.  Allergies: No Known Allergies  No current facility-administered medications for this encounter.   Current Outpatient Prescriptions  Medication Sig Dispense Refill  . Calcium Carbonate-Vitamin D (CALCIUM + D PO) Take by mouth daily.      . Fe Fum-FePoly-Vit C-Vit B3 (INTEGRA) 62.5-62.5-40-3 MG CAPS Take one daily  30 capsule  3  . fexofenadine (ALLEGRA) 30 MG tablet Take 30 mg by mouth daily.      . fish oil-omega-3 fatty acids 1000 MG capsule Take 1,200 mg by mouth daily.      . fluticasone (FLONASE) 50 MCG/ACT nasal spray Use one spray in each nostril daily for 2 weeks  16 g  0  . Glucos-MSM-C-Mn-Ginger-Willow (GLUCOSAMINE MSM COMPLEX PO) Take by mouth daily.      . Melatonin 5 MG TABS Take  5 mg by mouth at bedtime as needed.      . Multiple Vitamin (MULTIVITAMIN) capsule Take 1 capsule by mouth daily.      Marland Kitchen omeprazole (PRILOSEC) 20 MG capsule Take 20 mg by mouth daily.      . Simethicone (PHAZYME) 180 MG CAPS Take 2 capsules by mouth once.      Marland Kitchen albuterol (PROVENTIL HFA;VENTOLIN HFA) 108 (90 BASE) MCG/ACT inhaler Inhale 2 puffs into the lungs every 6 (six) hours as needed for wheezing.  1 Inhaler  1  . HYDROcodone-acetaminophen (NORCO/VICODIN) 5-325 MG per tablet Take 2 tablets by mouth every 4 (four) hours as needed.  15  tablet  0     Results for orders placed during the hospital encounter of 11/01/13 (from the past 48 hour(s))  URINALYSIS, ROUTINE W REFLEX MICROSCOPIC     Status: Abnormal   Collection Time    11/01/13 11:35 AM      Result Value Range   Color, Urine YELLOW  YELLOW   APPearance CLOUDY (*) CLEAR   Specific Gravity, Urine 1.025  1.005 - 1.030   pH 5.5  5.0 - 8.0   Glucose, UA NEGATIVE  NEGATIVE mg/dL   Hgb urine dipstick NEGATIVE  NEGATIVE   Bilirubin Urine NEGATIVE  NEGATIVE   Ketones, ur NEGATIVE  NEGATIVE mg/dL   Protein, ur NEGATIVE  NEGATIVE mg/dL   Urobilinogen, UA 0.2  0.0 - 1.0 mg/dL   Nitrite NEGATIVE  NEGATIVE   Leukocytes, UA SMALL (*) NEGATIVE  URINE MICROSCOPIC-ADD ON     Status: None   Collection Time    11/01/13 11:35 AM      Result Value Range   Squamous Epithelial / LPF RARE  RARE   WBC, UA 0-2  <3 WBC/hpf   Urine-Other MUCOUS PRESENT    CBC     Status: None   Collection Time    11/01/13 12:32 PM      Result Value Range   WBC 9.5  4.0 - 10.5 K/uL   RBC 3.93  3.87 - 5.11 MIL/uL   Hemoglobin 12.3  12.0 - 15.0 g/dL   HCT 16.1  09.6 - 04.5 %   MCV 93.1  78.0 - 100.0 fL   MCH 31.3  26.0 - 34.0 pg   MCHC 33.6  30.0 - 36.0 g/dL   RDW 40.9  81.1 - 91.4 %   Platelets 393  150 - 400 K/uL  COMPREHENSIVE METABOLIC PANEL     Status: Abnormal   Collection Time    11/01/13 12:32 PM      Result Value Range   Sodium 140  135 - 145 mEq/L   Potassium 4.2  3.5 - 5.1 mEq/L   Chloride 105  96 - 112 mEq/L   CO2 26  19 - 32 mEq/L   Glucose, Bld 97  70 - 99 mg/dL   BUN 14  6 - 23 mg/dL   Creatinine, Ser 7.82  0.50 - 1.10 mg/dL   Calcium 8.7  8.4 - 95.6 mg/dL   Total Protein 6.3  6.0 - 8.3 g/dL   Albumin 3.2 (*) 3.5 - 5.2 g/dL   AST 33  0 - 37 U/L   ALT 48 (*) 0 - 35 U/L   Alkaline Phosphatase 68  39 - 117 U/L   Total Bilirubin 0.2 (*) 0.3 - 1.2 mg/dL   GFR calc non Af Amer >90  >90 mL/min   GFR calc Af Amer >90  >90 mL/min  Comment: (NOTE)     The eGFR has been  calculated using the CKD EPI equation.     This calculation has not been validated in all clinical situations.     eGFR's persistently <90 mL/min signify possible Chronic Kidney     Disease.  LIPASE, BLOOD     Status: None   Collection Time    11/01/13 12:32 PM      Result Value Range   Lipase 17  11 - 59 U/L    US Abdomen Complete  10/30/2013   CLINICAL DATA:  Right upper quadrant pain  EXAM: ULTRASOUND ABDOMEN COMPLETE  COMPARISON:  None.  FINDINGS: Gallbladder:  Multiple gallstones are identified. Mild wall thickening is noted at 4 mm.  Common bile duct:  Diameter: 6 mm.  Liver:  No focal lesion is identified. Diffuse increased echogenicity is seen.  IVC:  No abnormality visualized.  Pancreas:  Visualized portion unremarkable.  Spleen:  Size and appearance within normal limits.  Right Kidney:  Length: 10.9 cm in length. Echogenicity is within normal limits. No mass lesion or hydronephrosis is noted. .  Left Kidney:  Length: 10.7 cm in length. Echogenicity within normal limits. No mass or hydronephrosis visualized.  Abdominal aorta:  No aneurysm visualized.  Other findings:  Mild ascites is noted throughout the abdomen.  IMPRESSION: Mild ascites.  Cholelithiasis with mild wall thickening. The wall thickening may be related to the ascites however.   Electronically Signed   By: Alcide Clever M.D.   On: 10/30/2013 16:24   US Abdomen Limited  11/01/2013   CLINICAL DATA:  Abdominal pain, right upper quadrant pain  EXAM: US ABDOMEN LIMITED - RIGHT UPPER QUADRANT  COMPARISON:  10/30/2013  FINDINGS: Gallbladder  Gallbladder wall thickening present up to 6 mm thick, nonspecific in the setting of ascites. Multiple shadowing calculi within gallbladder. Additional sludge within gallbladder. 20 mm gallstone appears fixed in position of the gallbladder neck. No sonographic Murphy sign.  Common bile duct  Diameter: Only a short segment visualized, 8 mm diameter at porta hepatis  Liver:  Appear small in size  without gross mass or nodularity. Hepatopetal portal venous flow.  Other Large volume ascites.  Small right pleural effusion.  Cystic lesion identified in left lower quadrant, 3.0 x 3.4 x 2.8 cm, uncertain etiology, could potentially be of left ovarian origin.  IMPRESSION: Sludge and multiple calculi within gallbladder including a 20 mm gallstone fixed in position at the gallbladder neck.  Gallbladder wall thickening is nonspecific, could be related to ascites or cholecystitis though no definite sonographic Murphy sign identified.  Mild extrahepatic biliary dilatation with CBD 8 mm diameter, recommend correlation with LFTs.  Significant ascites and small right pleural effusion.  Question left ovarian cyst.   Electronically Signed   By: Ulyses Southward M.D.   On: 11/01/2013 13:47    Review of Systems  Constitutional: Negative for fever, chills, weight loss and malaise/fatigue.  HENT: Negative.   Respiratory: Positive for shortness of breath. Negative for cough and wheezing.   Cardiovascular: Negative.   Gastrointestinal: Positive for abdominal pain and diarrhea. Negative for heartburn, nausea, vomiting, constipation and blood in stool.       Abdominal swelling and bloating and pressure   Blood pressure 124/72, pulse 79, temperature 98.1 F (36.7 C), temperature source Oral, resp. rate 16, last menstrual period 10/23/2013, SpO2 97.00%. Physical Exam General: Alert, well-developed Caucasian female, in no distress Skin: Warm and dry without rash or infection. HEENT: No palpable masses or  thyromegaly. Sclera nonicteric. Pupils equal round and reactive. Oropharynx clear. Lymph nodes: No cervical, supraclavicular,  nodes palpable. Lungs: Breath sounds clear and equal without increased work of breathing Cardiovascular: Regular rate and rhythm without murmur. No JVD or edema. Peripheral pulses intact. Abdomen: moderately distended with dullness to percussion in positive fluid wave. Soft and minimal right  upper quadrant tenderness with no guarding.. No masses palpable. No organomegaly. No palpable hernias. Extremities: No edema or joint swelling or deformity. No chronic venous stasis changes. Neurologic: Alert and fully oriented. Gait normal.  Assessment/Plan: Patient presents with localized right upper quadrant pain that is typical for biliary colic or cholecystitis and does have gallstones a slightly thickened gallbladder wall.  She however also has very significant ascites which could be responsible for gallbladder wall thickening. She is minimally tender in the right upper quadrant. She is really more symptomatic from her ascites by her estimation and she is from localized right upper quadrant pain. The source for her ascites is not clear and this degree of ascites would be very unusual with cholecystitis. Other possibilities would include ovarian neoplasm or liver disease. She does have a low albumin suggesting possibly liver disease or more significant chronic illness. I discussed options with the patient including admission today and proceeding with laparoscopic cholecystectomy at which time we could examine her abdomen laparoscopically and a sample ascites for testing. However she feels that her right upper carotid pain is not that bad that she would prefer to have workup for the ascites prior to surgery which I think would be the best course. We will therefore discharge the patient from the emergency department today and plan to proceed with CT scan of abdomen and pelvis and then pelvic ultrasound if that is unremarkable. She may require a GI evaluation and/or paracentesis. This was all discussed with the patient. We also discussed that if her symptoms worsen particularly more severe abdominal pain or fever or vomiting or other concerns that she should call immediately or return to the emergency room. She is comfortable with this plan.  Mallory Huber T 11/01/2013, 4:16 PM

## 2013-11-01 NOTE — ED Notes (Signed)
Pt c/o RUQ pain and distension x 1 week.  Pain score 7/10.  Pt was seen at [facility] for same on 12/22.  Sts "I was told that I have gallstones and need to follow up with Kae Heller MD, but he can't see me until January.  It is getting so bad that my PCP told me to come to the ER."  Hx of gallstones.

## 2013-11-01 NOTE — ED Provider Notes (Addendum)
CSN: [id number]     Arrival date & time 11/01/13  1013 History   First MD Initiated Contact with Patient 11/01/13 1115     Chief Complaint  Patient presents with  . Abdominal Pain   (Consider location/radiation/quality/duration/timing/severity/associated sxs/prior Treatment) HPI Pt presenting with c/o right upper abdominal pain which has been ongoing for the past week.  Pt has hx of bile duct obstruction diagnosed with HIDA scan several years ago but she had no significant symptoms- was seen by surgery and decided not to have surgery at that time.  Over the past week pt has had right upper abdominal pain, sensation of fullness after eating.  Also having loose stools.  Last episode of emesis was 2 days ago- nonbloody and nonbilious.  No fever/chills.  She was seen at [facility] on 12/22 and diagnosed with gallstones on ultrasound.  She has called for appointment with Dr. Abbey Chatters- has appt Jan 9th.  Pt has continued to have pain so came to the ED.  She has not wanted to take pain medication- because they make her dizzy.  There are no other associated systemic symptoms, there are no other alleviating or modifying factors. Has been drinking clear liquids only as eating more solid food seems to exacerbate the symptoms.    Past Medical History  Diagnosis Date  . Lipoma     on back  . Migraine   . Asthma with exacerbation    Past Surgical History  Procedure Laterality Date  . Cesarean section  86, 89, 90  . Lipoma excision     Family History  Problem Relation Age of Onset  . Hypertension Mother   . Hypertension Father   . Cancer Maternal Aunt     breast  . Hyperlipidemia Neg Hx   . Heart attack Neg Hx   . Diabetes Neg Hx   . Sudden death Neg Hx    History  Substance Use Topics  . Smoking status: Never Smoker   . Smokeless tobacco: Never Used  . Alcohol Use: Yes     Comment: occ   OB History   Grav Para Term Preterm Abortions TAB SAB Ect Mult Living   4         3     Review of  Systems ROS reviewed and all otherwise negative except for mentioned in HPI  Allergies  Review of patient's allergies indicates no known allergies.  Home Medications   Current Outpatient Rx  Name  Route  Sig  Dispense  Refill  . Calcium Carbonate-Vitamin D (CALCIUM + D PO)   Oral   Take by mouth daily.         . Fe Fum-FePoly-Vit C-Vit B3 (INTEGRA) 62.5-62.5-40-3 MG CAPS      Take one daily   30 capsule   3   . fexofenadine (ALLEGRA) 30 MG tablet   Oral   Take 30 mg by mouth daily.         . fish oil-omega-3 fatty acids 1000 MG capsule   Oral   Take 1,200 mg by mouth daily.         . fluticasone (FLONASE) 50 MCG/ACT nasal spray      Use one spray in each nostril daily for 2 weeks   16 g   0   . Glucos-MSM-C-Mn-Ginger-Willow (GLUCOSAMINE MSM COMPLEX PO)   Oral   Take by mouth daily.         . Melatonin 5 MG TABS   Oral   Take  5 mg by mouth at bedtime as needed.         . Multiple Vitamin (MULTIVITAMIN) capsule   Oral   Take 1 capsule by mouth daily.         Marland Kitchen omeprazole (PRILOSEC) 20 MG capsule   Oral   Take 20 mg by mouth daily.         . Simethicone (PHAZYME) 180 MG CAPS   Oral   Take 2 capsules by mouth once.         Marland Kitchen albuterol (PROVENTIL HFA;VENTOLIN HFA) 108 (90 BASE) MCG/ACT inhaler   Inhalation   Inhale 2 puffs into the lungs every 6 (six) hours as needed for wheezing.   1 Inhaler   1    BP 124/72  Pulse 79  Temp(Src) 98.1 F (36.7 C) (Oral)  Resp 16  SpO2 97%  LMP 10/23/2013 Vitals reviewed Physical Exam Physical Examination: General appearance - alert, well appearing, and in no distress Mental status - alert, oriented to person, place, and time Eyes - no conjunctival injection, no scleral icterus Mouth - mucous membranes moist, pharynx normal without lesions Chest - clear to auscultation, no wheezes, rales or rhonchi, symmetric air entry Heart - normal rate, regular rhythm, normal S1, S2, no murmurs, rubs, clicks or  gallops Abdomen - soft, ttp in right upper abdomen, nabs, nondistended, no masses or organomegaly, no gaurding or rebound Extremities - peripheral pulses normal, no pedal edema, no clubbing or cyanosis Skin - normal coloration and turgor, no rashes  ED Course  Procedures (including critical care time)  2:04 PM discussed findings with Dr. Johna Sheriff, surgery, he will see patient in the ED  2:34 PM pt updated about findings and plan.   4:15 PM pt seen by Dr. Johna Sheriff- they are going to evalute her ascites further with abdominal CT and pelvic ultrasound as an outpatient.  Will d/c with pain med prescription.   Labs Review Labs Reviewed  COMPREHENSIVE METABOLIC PANEL - Abnormal; Notable for the following:    Albumin 3.2 (*)    ALT 48 (*)    Total Bilirubin 0.2 (*)    All other components within normal limits  URINALYSIS, ROUTINE W REFLEX MICROSCOPIC - Abnormal; Notable for the following:    APPearance CLOUDY (*)    Leukocytes, UA SMALL (*)    All other components within normal limits  CBC  LIPASE, BLOOD  URINE MICROSCOPIC-ADD ON   Imaging Review US Abdomen Complete  10/30/2013   CLINICAL DATA:  Right upper quadrant pain  EXAM: ULTRASOUND ABDOMEN COMPLETE  COMPARISON:  None.  FINDINGS: Gallbladder:  Multiple gallstones are identified. Mild wall thickening is noted at 4 mm.  Common bile duct:  Diameter: 6 mm.  Liver:  No focal lesion is identified. Diffuse increased echogenicity is seen.  IVC:  No abnormality visualized.  Pancreas:  Visualized portion unremarkable.  Spleen:  Size and appearance within normal limits.  Right Kidney:  Length: 10.9 cm in length. Echogenicity is within normal limits. No mass lesion or hydronephrosis is noted. .  Left Kidney:  Length: 10.7 cm in length. Echogenicity within normal limits. No mass or hydronephrosis visualized.  Abdominal aorta:  No aneurysm visualized.  Other findings:  Mild ascites is noted throughout the abdomen.  IMPRESSION: Mild ascites.   Cholelithiasis with mild wall thickening. The wall thickening may be related to the ascites however.   Electronically Signed   By: Alcide Clever M.D.   On: 10/30/2013 16:24   US Abdomen Limited  11/01/2013   CLINICAL DATA:  Abdominal pain, right upper quadrant pain  EXAM: US ABDOMEN LIMITED - RIGHT UPPER QUADRANT  COMPARISON:  10/30/2013  FINDINGS: Gallbladder  Gallbladder wall thickening present up to 6 mm thick, nonspecific in the setting of ascites. Multiple shadowing calculi within gallbladder. Additional sludge within gallbladder. 20 mm gallstone appears fixed in position of the gallbladder neck. No sonographic Murphy sign.  Common bile duct  Diameter: Only a short segment visualized, 8 mm diameter at porta hepatis  Liver:  Appear small in size without gross mass or nodularity. Hepatopetal portal venous flow.  Other Large volume ascites.  Small right pleural effusion.  Cystic lesion identified in left lower quadrant, 3.0 x 3.4 x 2.8 cm, uncertain etiology, could potentially be of left ovarian origin.  IMPRESSION: Sludge and multiple calculi within gallbladder including a 20 mm gallstone fixed in position at the gallbladder neck.  Gallbladder wall thickening is nonspecific, could be related to ascites or cholecystitis though no definite sonographic Murphy sign identified.  Mild extrahepatic biliary dilatation with CBD 8 mm diameter, recommend correlation with LFTs.  Significant ascites and small right pleural effusion.  Question left ovarian cyst.   Electronically Signed   By: Ulyses Southward M.D.   On: 11/01/2013 13:47    EKG Interpretation   None       MDM   1. Cholelithiasis   2. Abdominal pain    Pt presenting with c/o continued abdominal pain with US findings of gallstones earlier this week.  Labs reveal ALT mildly elevated, US shows gallstones and small amount of ascites.  Pt is requesting not to have any pain medication until she is evaluated by the surgeon.  I have consulted Dr. Johna Sheriff  they will see the patient in the ED.  Pt had not wanted to take pain medication at home, however she would be agreeable with taking pain medication at home if the surgeon feels that it is appropriate for her to have scheduled surgery at a later date.      Ethelda Chick, MD 11/01/13 1553  Ethelda Chick, MD 11/01/13 [phone]

## 2013-11-02 ENCOUNTER — Emergency Department (HOSPITAL_COMMUNITY): Payer: Managed Care, Other (non HMO)

## 2013-11-02 ENCOUNTER — Emergency Department (HOSPITAL_COMMUNITY)
Admission: EM | Admit: 2013-11-02 | Discharge: 2013-11-03 | Disposition: A | Discharge status: Home / Self Care | Payer: Managed Care, Other (non HMO) | Attending: Emergency Medicine | Admitting: Emergency Medicine

## 2013-11-02 ENCOUNTER — Encounter (HOSPITAL_COMMUNITY): Payer: Self-pay | Admitting: Emergency Medicine

## 2013-11-02 DIAGNOSIS — IMO0002 Reserved for concepts with insufficient information to code with codable children: Secondary | ICD-10-CM | POA: Insufficient documentation

## 2013-11-02 DIAGNOSIS — Z8639 Personal history of other endocrine, nutritional and metabolic disease: Secondary | ICD-10-CM | POA: Insufficient documentation

## 2013-11-02 DIAGNOSIS — R188 Other ascites: Secondary | ICD-10-CM

## 2013-11-02 DIAGNOSIS — Z792 Long term (current) use of antibiotics: Secondary | ICD-10-CM | POA: Insufficient documentation

## 2013-11-02 DIAGNOSIS — Z90710 Acquired absence of both cervix and uterus: Secondary | ICD-10-CM | POA: Insufficient documentation

## 2013-11-02 DIAGNOSIS — Z9079 Acquired absence of other genital organ(s): Secondary | ICD-10-CM | POA: Insufficient documentation

## 2013-11-02 DIAGNOSIS — J45901 Unspecified asthma with (acute) exacerbation: Secondary | ICD-10-CM | POA: Insufficient documentation

## 2013-11-02 DIAGNOSIS — Z862 Personal history of diseases of the blood and blood-forming organs and certain disorders involving the immune mechanism: Secondary | ICD-10-CM | POA: Insufficient documentation

## 2013-11-02 DIAGNOSIS — R110 Nausea: Secondary | ICD-10-CM | POA: Insufficient documentation

## 2013-11-02 DIAGNOSIS — Z8543 Personal history of malignant neoplasm of ovary: Secondary | ICD-10-CM | POA: Insufficient documentation

## 2013-11-02 DIAGNOSIS — Z79899 Other long term (current) drug therapy: Secondary | ICD-10-CM | POA: Insufficient documentation

## 2013-11-02 DIAGNOSIS — Z8679 Personal history of other diseases of the circulatory system: Secondary | ICD-10-CM | POA: Insufficient documentation

## 2013-11-02 LAB — URINALYSIS, ROUTINE W REFLEX MICROSCOPIC
Glucose, UA: NEGATIVE mg/dL
Leukocytes, UA: NEGATIVE
Nitrite: NEGATIVE
Specific Gravity, Urine: 1.03 (ref 1.005–1.030)
pH: 6 (ref 5.0–8.0)

## 2013-11-02 LAB — URINE MICROSCOPIC-ADD ON

## 2013-11-02 LAB — COMPREHENSIVE METABOLIC PANEL
AST: 33 U/L (ref 0–37)
Albumin: 3.3 g/dL — ABNORMAL LOW (ref 3.5–5.2)
Calcium: 8.7 mg/dL (ref 8.4–10.5)
Chloride: 102 mEq/L (ref 96–112)
Creatinine, Ser: 0.7 mg/dL (ref 0.50–1.10)
Potassium: 3.4 mEq/L — ABNORMAL LOW (ref 3.5–5.1)
Total Bilirubin: 0.2 mg/dL — ABNORMAL LOW (ref 0.3–1.2)
Total Protein: 6.5 g/dL (ref 6.0–8.3)

## 2013-11-02 LAB — CBC WITH DIFFERENTIAL/PLATELET
Basophils Absolute: 0 10*3/uL (ref 0.0–0.1)
Basophils Relative: 0 % (ref 0–1)
HCT: 38.3 % (ref 36.0–46.0)
MCHC: 33.9 g/dL (ref 30.0–36.0)
MCV: 91 fL (ref 78.0–100.0)
Monocytes Absolute: 0.6 10*3/uL (ref 0.1–1.0)
Neutro Abs: 7.7 10*3/uL (ref 1.7–7.7)
Neutrophils Relative %: 82 % — ABNORMAL HIGH (ref 43–77)
RDW: 12.8 % (ref 11.5–15.5)

## 2013-11-02 MED ORDER — IOHEXOL 300 MG/ML  SOLN
50.0000 mL | Freq: Once | INTRAMUSCULAR | Status: AC | PRN
  Administered 2013-11-02: 50 mL via ORAL

## 2013-11-02 MED ORDER — IOHEXOL 300 MG/ML  SOLN
100.0000 mL | Freq: Once | INTRAMUSCULAR | Status: AC | PRN
  Administered 2013-11-02: 100 mL via INTRAVENOUS

## 2013-11-02 NOTE — ED Notes (Signed)
Pt to chest xray at this time

## 2013-11-02 NOTE — ED Provider Notes (Signed)
CSN: [id number]     Arrival date & time 11/02/13  2037 History   First MD Initiated Contact with Patient 11/02/13 2055     Chief Complaint  Patient presents with  . Shortness of Breath  . Abdominal Pain   (Consider location/radiation/quality/duration/timing/severity/associated sxs/prior Treatment) Patient is a 52 y.o. female presenting with abdominal pain. The history is provided by the patient. No language interpreter was used.  Abdominal Pain Pain location:  LUQ and RUQ Pain quality: aching and fullness   Associated symptoms: nausea and shortness of breath   Associated symptoms: no chest pain, no chills, no diarrhea, no fever and no vomiting   Nausea:    Severity:  Mild Shortness of breath:    Severity:  Moderate   Onset quality:  Gradual Pt is a 52 year old female who presents with abdominal pain and increased swelling with gradually more shortness of breath in the last 24 hours. She was seen yesterday here, had a US done which showed cholelithiasis with a possible stone at the neck of the gall bladder with wall thickening and ascites and a small cyst on her ovaries. She denies fever, chills or vomiting. She is having some nausea and returned tonight due to her increasing discomfort and shortness of breath. She feels full and bloated and is unable to eat but is tolerating oral fluids. Dr. Odie Sera was consulted yesterday and planned outpatient CT or return to ER if symptoms worsen.         Past Medical History  Diagnosis Date  . Lipoma     on back  . Migraine   . Asthma with exacerbation   . Ovarian cancer    Past Surgical History  Procedure Laterality Date  . Cesarean section  86, 89, 90  . Lipoma excision    . Abdominal hysterectomy    . Bilateral oophorectomy  11/04/2013  . Bilateral salpingectomy  11/04/2013  . Omentectomy  11/04/2013   Family History  Problem Relation Age of Onset  . Hypertension Mother   . Hypertension Father   . Cancer Maternal Aunt    breast  . Hyperlipidemia Neg Hx   . Heart attack Neg Hx   . Diabetes Neg Hx   . Sudden death Neg Hx    History  Substance Use Topics  . Smoking status: Never Smoker   . Smokeless tobacco: Never Used  . Alcohol Use: Yes     Comment: occ   OB History   Grav Para Term Preterm Abortions TAB SAB Ect Mult Living   4 3 3  1  1   3      Review of Systems  Constitutional: Negative for fever and chills.  Respiratory: Positive for shortness of breath. Negative for wheezing.   Cardiovascular: Negative for chest pain.  Gastrointestinal: Positive for nausea, abdominal pain and abdominal distention. Negative for vomiting and diarrhea.  All other systems reviewed and are negative.    Allergies  Review of patient's allergies indicates no known allergies.  Home Medications   Current Outpatient Rx  Name  Route  Sig  Dispense  Refill  . albuterol (PROVENTIL HFA;VENTOLIN HFA) 108 (90 BASE) MCG/ACT inhaler   Inhalation   Inhale 2 puffs into the lungs every 6 (six) hours as needed for wheezing.   1 Inhaler   1   . Calcium Carbonate-Vitamin D (CALCIUM + D PO)   Oral   Take 1 tablet by mouth daily.          Marland Kitchen  Fe Fum-FePoly-Vit C-Vit B3 (INTEGRA) 62.5-62.5-40-3 MG CAPS   Oral   Take 1 capsule by mouth daily. Take one daily         . fexofenadine (ALLEGRA) 30 MG tablet   Oral   Take 30 mg by mouth daily.         . fluticasone (FLONASE) 50 MCG/ACT nasal spray   Nasal   Place 1 spray into the nose daily as needed for allergies or rhinitis. Use one spray in each nostril daily for 2 weeks         . Glucos-MSM-C-Mn-Ginger-Willow (GLUCOSAMINE MSM COMPLEX PO)   Oral   Take 2 tablets by mouth daily.          . Melatonin 5 MG TABS   Oral   Take 5 mg by mouth at bedtime as needed (sleep).          . Multiple Vitamin (MULTIVITAMIN) capsule   Oral   Take 1 capsule by mouth daily.         . Omega-3 Fatty Acids (FISH OIL) 1200 MG CPDR   Oral   Take 2,400 mg by mouth daily.          . Simethicone (PHAZYME) 180 MG CAPS   Oral   Take 2 capsules by mouth once.         . cephALEXin (KEFLEX) 500 MG capsule   Oral   Take 500 mg by mouth 4 (four) times daily.         Marland Kitchen docusate sodium (COLACE) 100 MG capsule   Oral   Take 100 mg by mouth 2 (two) times daily.         Marland Kitchen enoxaparin (LOVENOX) 40 MG/0.4ML injection   Subcutaneous   Inject 40 mg into the skin daily.         Marland Kitchen oxyCODONE-acetaminophen (PERCOCET) 10-325 MG per tablet   Oral   Take 1-2 tablets by mouth every 4 (four) hours as needed for pain (depends on pain level if takes 1 or 2 tablets).           BP 144/85  Pulse 88  Temp(Src) 97.9 F (36.6 C) (Oral)  Resp 18  Ht 5\' 3"  (1.6 m)  Wt 150 lb (68.04 kg)  BMI 26.58 kg/m2  SpO2 98%  LMP 10/23/2013 Physical Exam  Vitals reviewed. Constitutional: She is oriented to person, place, and time. She appears well-developed and well-nourished. No distress.  HENT:  Head: Atraumatic.  Eyes: Conjunctivae and EOM are normal.  Neck: Normal range of motion. Neck supple. No JVD present. No tracheal deviation present. No thyromegaly present.  Cardiovascular: Normal rate, regular rhythm and normal heart sounds.   Pulmonary/Chest: Breath sounds normal. No respiratory distress. She has no wheezes.  Abdominal: Bowel sounds are normal. She exhibits distension and ascites. There is tenderness in the right upper quadrant and left upper quadrant. There is guarding. There is no rebound, no CVA tenderness, no tenderness at McBurney's point and negative Murphy's sign.  Musculoskeletal: Normal range of motion.  Lymphadenopathy:    She has no cervical adenopathy.  Neurological: She is alert and oriented to person, place, and time.  Skin: Skin is warm and dry.  Psychiatric: She has a normal mood and affect. Her behavior is normal. Judgment and thought content normal.    ED Course  Procedures (including critical care time) Labs Review Labs Reviewed  CBC WITH  DIFFERENTIAL - Abnormal; Notable for the following:    Neutrophils Relative % 82 (*)  Lymphocytes Relative 10 (*)    All other components within normal limits  COMPREHENSIVE METABOLIC PANEL - Abnormal; Notable for the following:    Potassium 3.4 (*)    Glucose, Bld 108 (*)    Albumin 3.3 (*)    ALT 53 (*)    Total Bilirubin 0.2 (*)    All other components within normal limits  URINALYSIS, ROUTINE W REFLEX MICROSCOPIC - Abnormal; Notable for the following:    Color, Urine AMBER (*)    Protein, ur 30 (*)    All other components within normal limits  URINE MICROSCOPIC-ADD ON - Abnormal; Notable for the following:    Bacteria, UA FEW (*)    All other components within normal limits  LIPASE, BLOOD   Imaging Review No results found.  EKG Interpretation   None      Plan: Scheduled [place] however, she is having shortness of breath and increasing pain and abdominal swelling. Will obtain abd/pelvis CT here in ER.   MDM   1. Ascites    Afebrile, no leukocytosis. Lytes stable. Chest x-ray unremarkable and respiratory status stable. No hypoxia, tachycardia or tachypnea. Abd/pelvis CT; consistent with large amount of abdominal ascites and possible ovarian neoplasm. Called general surgery and Ob/Gyn for follow-up. Pt elects to follow-up with her Ob/Gyn to obtain referral for surgery. Discussed CT results, follow-up and plan at length with family and pt. She agrees and understands plan of care. Pt stable for discharge. Xanax prescription given.      Irish Elders, NP 11/13/13 1053

## 2013-11-02 NOTE — ED Notes (Signed)
Pt arrived to the ED with a complaint of shortness of breath that is being caused by abdominal swelling.  Pt states she has been in the ED and hospital attempting to verify reason for ascites of her abdomen.  Pt had an ultrasound of the upper abdomen in which the pt states she was told that something was seen on her ovaries.  Pt states she is scheduled for a lower ultrasound and a CT in the following days.  Pt was told by Dr. Jeanella Flattery if pt experienced greater shortness of breath to return to ED for inpatient placement

## 2013-11-03 ENCOUNTER — Other Ambulatory Visit (INDEPENDENT_AMBULATORY_CARE_PROVIDER_SITE_OTHER): Payer: Self-pay | Admitting: *Deleted

## 2013-11-03 ENCOUNTER — Telehealth (INDEPENDENT_AMBULATORY_CARE_PROVIDER_SITE_OTHER): Payer: Self-pay | Admitting: *Deleted

## 2013-11-03 DIAGNOSIS — R109 Unspecified abdominal pain: Secondary | ICD-10-CM

## 2013-11-03 DIAGNOSIS — R188 Other ascites: Secondary | ICD-10-CM

## 2013-11-03 MED ORDER — ALPRAZOLAM 0.5 MG PO TABS: 0.5000 mg | ORAL_TABLET | Freq: Every evening | ORAL | Status: DC | PRN

## 2013-11-03 NOTE — Telephone Encounter (Signed)
I spoke with pt and informed her of the appt for her [place] on 12/29 with an arrival time of 10:45am at [id number].  Pt is agreeable with this appt and is waiting to her back from her OBGYN about an appt.  Pt had a CT scan done yesterday in the ED.

## 2013-11-04 HISTORY — PX: BILATERAL SALPINGECTOMY: SHX5743

## 2013-11-04 HISTORY — PX: BILATERAL OOPHORECTOMY: SHX1221

## 2013-11-04 HISTORY — PX: OMENTECTOMY: SHX2098

## 2013-11-06 ENCOUNTER — Other Ambulatory Visit: Payer: Managed Care, Other (non HMO)

## 2013-11-12 ENCOUNTER — Encounter (HOSPITAL_COMMUNITY): Payer: BC Managed Care – PPO | Admitting: Anesthesiology

## 2013-11-12 ENCOUNTER — Inpatient Hospital Stay (HOSPITAL_COMMUNITY)
Admission: AD | Admit: 2013-11-12 | Discharge: 2013-11-12 | Disposition: A | Discharge status: Home / Self Care | Payer: BC Managed Care – PPO | Source: Ambulatory Visit | Attending: Obstetrics & Gynecology | Admitting: Obstetrics & Gynecology

## 2013-11-12 ENCOUNTER — Encounter (HOSPITAL_COMMUNITY)
Admission: AD | Disposition: A | Discharge status: Home / Self Care | Payer: Self-pay | Source: Ambulatory Visit | Attending: Obstetrics & Gynecology

## 2013-11-12 ENCOUNTER — Inpatient Hospital Stay (HOSPITAL_COMMUNITY): Payer: BC Managed Care – PPO | Admitting: Anesthesiology

## 2013-11-12 ENCOUNTER — Encounter (HOSPITAL_COMMUNITY): Payer: Self-pay | Admitting: *Deleted

## 2013-11-12 DIAGNOSIS — T8131XA Disruption of external operation (surgical) wound, not elsewhere classified, initial encounter: Secondary | ICD-10-CM | POA: Insufficient documentation

## 2013-11-12 DIAGNOSIS — C569 Malignant neoplasm of unspecified ovary: Secondary | ICD-10-CM | POA: Diagnosis present

## 2013-11-12 DIAGNOSIS — Y838 Other surgical procedures as the cause of abnormal reaction of the patient, or of later complication, without mention of misadventure at the time of the procedure: Secondary | ICD-10-CM | POA: Insufficient documentation

## 2013-11-12 HISTORY — DX: Malignant neoplasm of unspecified ovary: C56.9

## 2013-11-12 HISTORY — PX: REPAIR VAGINAL CUFF: SHX6067

## 2013-11-12 LAB — COMPREHENSIVE METABOLIC PANEL
ALK PHOS: 141 U/L — AB (ref 39–117)
ALT: 77 U/L — ABNORMAL HIGH (ref 0–35)
AST: 49 U/L — ABNORMAL HIGH (ref 0–37)
Albumin: 2.6 g/dL — ABNORMAL LOW (ref 3.5–5.2)
BUN: 7 mg/dL (ref 6–23)
CHLORIDE: 100 meq/L (ref 96–112)
CO2: 27 mEq/L (ref 19–32)
CREATININE: 0.48 mg/dL — AB (ref 0.50–1.10)
Calcium: 8.8 mg/dL (ref 8.4–10.5)
GLUCOSE: 101 mg/dL — AB (ref 70–99)
POTASSIUM: 4.3 meq/L (ref 3.7–5.3)
Sodium: 140 mEq/L (ref 137–147)
Total Bilirubin: <0.2 mg/dL — ABNORMAL LOW (ref 0.3–1.2)
Total Protein: 5.8 g/dL — ABNORMAL LOW (ref 6.0–8.3)

## 2013-11-12 LAB — CBC
HCT: 29 % — ABNORMAL LOW (ref 36.0–46.0)
HEMOGLOBIN: 9.6 g/dL — AB (ref 12.0–15.0)
MCH: 30.1 pg (ref 26.0–34.0)
MCHC: 33.1 g/dL (ref 30.0–36.0)
MCV: 90.9 fL (ref 78.0–100.0)
Platelets: 516 10*3/uL — ABNORMAL HIGH (ref 150–400)
RBC: 3.19 MIL/uL — ABNORMAL LOW (ref 3.87–5.11)
RDW: 12.8 % (ref 11.5–15.5)
WBC: 8.3 10*3/uL (ref 4.0–10.5)

## 2013-11-12 LAB — TYPE AND SCREEN
ABO/RH(D): O NEG
ANTIBODY SCREEN: NEGATIVE

## 2013-11-12 LAB — ABO/RH: ABO/RH(D): O NEG

## 2013-11-12 SURGERY — REPAIR, VAGINAL CUFF
Anesthesia: General | Site: Vagina

## 2013-11-12 MED ORDER — DEXAMETHASONE SODIUM PHOSPHATE 10 MG/ML IJ SOLN
INTRAMUSCULAR | Status: AC
  Filled 2013-11-12: qty 1

## 2013-11-12 MED ORDER — ONDANSETRON HCL 4 MG/2ML IJ SOLN
INTRAMUSCULAR | Status: AC
  Filled 2013-11-12: qty 2

## 2013-11-12 MED ORDER — FENTANYL CITRATE 0.05 MG/ML IJ SOLN
INTRAMUSCULAR | Status: DC | PRN
  Administered 2013-11-12: 50 ug via INTRAVENOUS
  Administered 2013-11-12: 100 ug via INTRAVENOUS

## 2013-11-12 MED ORDER — MIDAZOLAM HCL 2 MG/2ML IJ SOLN
INTRAMUSCULAR | Status: AC
  Filled 2013-11-12: qty 2

## 2013-11-12 MED ORDER — DEXAMETHASONE SODIUM PHOSPHATE 4 MG/ML IJ SOLN
INTRAMUSCULAR | Status: DC | PRN
  Administered 2013-11-12: 10 mg via INTRAVENOUS

## 2013-11-12 MED ORDER — LACTATED RINGERS IV SOLN
INTRAVENOUS | Status: DC
  Administered 2013-11-12 (×2): via INTRAVENOUS

## 2013-11-12 MED ORDER — HYDROMORPHONE HCL PF 1 MG/ML IJ SOLN
INTRAMUSCULAR | Status: AC
  Filled 2013-11-12: qty 1

## 2013-11-12 MED ORDER — FENTANYL CITRATE 0.05 MG/ML IJ SOLN
INTRAMUSCULAR | Status: AC
  Filled 2013-11-12: qty 5

## 2013-11-12 MED ORDER — PROPOFOL 10 MG/ML IV BOLUS
INTRAVENOUS | Status: DC | PRN
  Administered 2013-11-12: 150 mg via INTRAVENOUS

## 2013-11-12 MED ORDER — ONDANSETRON HCL 4 MG/2ML IJ SOLN
INTRAMUSCULAR | Status: DC | PRN
  Administered 2013-11-12: 4 mg via INTRAVENOUS

## 2013-11-12 MED ORDER — LIDOCAINE HCL (CARDIAC) 20 MG/ML IV SOLN
INTRAVENOUS | Status: AC
  Filled 2013-11-12: qty 5

## 2013-11-12 MED ORDER — HYDROMORPHONE HCL PF 1 MG/ML IJ SOLN
INTRAMUSCULAR | Status: DC | PRN
  Administered 2013-11-12: 1 mg via INTRAVENOUS
  Administered 2013-11-12: 0.5 mg via INTRAVENOUS

## 2013-11-12 MED ORDER — LIDOCAINE HCL (CARDIAC) 20 MG/ML IV SOLN
INTRAVENOUS | Status: DC | PRN
  Administered 2013-11-12: 40 mg via INTRAVENOUS
  Administered 2013-11-12: 60 mg via INTRAVENOUS

## 2013-11-12 MED ORDER — MIDAZOLAM HCL 2 MG/2ML IJ SOLN
INTRAMUSCULAR | Status: DC | PRN
  Administered 2013-11-12: 2 mg via INTRAVENOUS

## 2013-11-12 MED ORDER — PROPOFOL 10 MG/ML IV EMUL
INTRAVENOUS | Status: AC
  Filled 2013-11-12: qty 20

## 2013-11-12 MED ORDER — CEFAZOLIN SODIUM-DEXTROSE 2-3 GM-% IV SOLR
2.0000 g | INTRAVENOUS | Status: AC
  Administered 2013-11-12: 2 g via INTRAVENOUS
  Filled 2013-11-12: qty 50

## 2013-11-12 SURGICAL SUPPLY — 8 items
CANISTER SUCTION 2500CC (MISCELLANEOUS) ×3 IMPLANT
ELECT REM PT RETURN 9FT ADLT (ELECTROSURGICAL) ×3
ELECTRODE REM PT RTRN 9FT ADLT (ELECTROSURGICAL) ×1 IMPLANT
GLOVE BIOGEL M STER SZ 6 (GLOVE) ×3 IMPLANT
GOWN SURGICAL XLG (GOWNS) ×6 IMPLANT
PACK VAGINAL MINOR WOMEN LF (CUSTOM PROCEDURE TRAY) ×3 IMPLANT
SUT MNCRL AB 0 CT1 27 (SUTURE) ×6 IMPLANT
SYR BULB IRRIGATION 50ML (SYRINGE) ×3 IMPLANT

## 2013-11-12 NOTE — Anesthesia Postprocedure Evaluation (Signed)
Anesthesia Post Note  Patient: Geographical information systems officer  Procedure(s) Performed: Procedure(s) (LRB): REPAIR VAGINAL CUFF (N/A)  Anesthesia type: General  Patient location: PACU  Post pain: Pain level controlled  Post assessment: Post-op Vital signs reviewed  Last Vitals:  Filed Vitals:   11/12/13 1018  BP: 129/74  Pulse: 85  Temp: 36.8 C  Resp: 20    Post vital signs: Reviewed  Level of consciousness: sedated  Complications: No apparent anesthesia complications

## 2013-11-12 NOTE — Progress Notes (Signed)
Please see full H&P performed by Dr. Kennon Rounds.    Briefly, patient is POD#8 s/p TAH/BSO, debulking for ovarian CA performed at [facility].  She had 3L of ascites removed at time of surgery.  She has done well postoperatively until last night when she had leakage of serosanguinous fluid vaginally.  Exam reveals small cuff dehiscence with no evisceration noted.  Patient is 8 hours NPO and had last LMWH injection yesterday at 1pm.  Counseled regarding the procedure and risk of bleeding, infection and continued leakage.  All questions answered.  Will proceed with cuff repair.

## 2013-11-12 NOTE — MAU Provider Note (Signed)
History     CSN: [id number]  Arrival date and time: 11/12/13 0344   First Provider Initiated Contact with Patient 11/12/13 [phone]      Chief Complaint  Patient presents with  . Vaginal Discharge   Vaginal Discharge The patient's primary symptoms include a vaginal discharge. Pertinent negatives include no back pain, chills, dysuria, fever, nausea or vomiting.   Pt. Is a pt. Of Dr. Helane Rima, last seen 2 years ago.  Presented to multiple ED's week of Christmas with ascites.  Found to need GYN/ONC and Dr. Gaetano Net arranged for transfer to [facility].  Pt. Admitted there 12/26 and underwent TAH/BSO, omentectomy and radical debulking.  Stayed until 1/2 awaiting return of bowel function.  Home x 36 hours and noticed possible return of ascites yesterday.  Began leaking clear fluid from vagina last pm.  Awoke in a puddle of fluid this am and came in to MAU as directed by GYN/ONC at [facility].  Seen by CNM who called [facility] to evaluate the patient.    OB History   Grav Para Term Preterm Abortions TAB SAB Ect Mult Living   4         3      Past Medical History  Diagnosis Date  . Lipoma     on back  . Migraine   . Asthma with exacerbation     Past Surgical History  Procedure Laterality Date  . Cesarean section  86, 89, 90  . Lipoma excision    . Abdominal hysterectomy    . Bilateral oophorectomy      Family History  Problem Relation Age of Onset  . Hypertension Mother   . Hypertension Father   . Cancer Maternal Aunt     breast  . Hyperlipidemia Neg Hx   . Heart attack Neg Hx   . Diabetes Neg Hx   . Sudden death Neg Hx     History  Substance Use Topics  . Smoking status: Never Smoker   . Smokeless tobacco: Never Used  . Alcohol Use: Yes     Comment: occ    Allergies: No Known Allergies  Prescriptions prior to admission  Medication Sig Dispense Refill  . Calcium Carbonate-Vitamin D (CALCIUM + D PO) Take 1 tablet by mouth daily.       . cephALEXin (KEFLEX) 500 MG capsule  Take 500 mg by mouth 4 (four) times daily.      Marland Kitchen docusate sodium (COLACE) 100 MG capsule Take 100 mg by mouth 2 (two) times daily.      Marland Kitchen enoxaparin (LOVENOX) 40 MG/0.4ML injection Inject 40 mg into the skin daily.      . Fe Fum-FePoly-Vit C-Vit B3 (INTEGRA) 62.5-62.5-40-3 MG CAPS Take 1 capsule by mouth daily. Take one daily      . fexofenadine (ALLEGRA) 30 MG tablet Take 30 mg by mouth daily.      . fluticasone (FLONASE) 50 MCG/ACT nasal spray Place 1 spray into the nose daily as needed for allergies or rhinitis. Use one spray in each nostril daily for 2 weeks      . Melatonin 5 MG TABS Take 5 mg by mouth at bedtime as needed (sleep).       . Multiple Vitamin (MULTIVITAMIN) capsule Take 1 capsule by mouth daily.      . Omega-3 Fatty Acids (FISH OIL) 1200 MG CPDR Take 2,400 mg by mouth daily.      Marland Kitchen oxyCODONE-acetaminophen (PERCOCET) 10-325 MG per tablet Take 1 tablet by mouth  every 4 (four) hours as needed for pain.      Marland Kitchen albuterol (PROVENTIL HFA;VENTOLIN HFA) 108 (90 BASE) MCG/ACT inhaler Inhale 2 puffs into the lungs every 6 (six) hours as needed for wheezing.  1 Inhaler  1  . ALPRAZolam (XANAX) 0.5 MG tablet Take 1 tablet (0.5 mg total) by mouth at bedtime as needed for anxiety.  20 tablet  0  . Glucos-MSM-C-Mn-Ginger-Willow (GLUCOSAMINE MSM COMPLEX PO) Take 2 tablets by mouth daily.       Marland Kitchen HYDROcodone-acetaminophen (NORCO/VICODIN) 5-325 MG per tablet Take 2 tablets by mouth once.      Marland Kitchen omeprazole (PRILOSEC) 20 MG capsule Take 20 mg by mouth daily as needed (heart burn).       . Simethicone (PHAZYME) 180 MG CAPS Take 2 capsules by mouth once.        Review of Systems  Constitutional: Negative for fever and chills.  HENT: Negative for congestion.   Respiratory: Negative for shortness of breath (associated with feeling of ascites).   Cardiovascular: Negative for chest pain and leg swelling.  Gastrointestinal: Negative for nausea and vomiting.       Bloating  Genitourinary: Positive  for vaginal discharge. Negative for dysuria.  Musculoskeletal: Negative for back pain and neck pain.  Psychiatric/Behavioral: The patient is not nervous/anxious.    Physical Exam   Blood pressure 120/66, pulse 85, temperature 98.7 F (37.1 C), resp. rate 20, height 5\' 3"  (1.6 m), weight 158 lb 9.6 oz (71.94 kg), last menstrual period 10/23/2013, SpO2 97.00%.  Physical Exam  Vitals reviewed. Constitutional: She appears well-developed and well-nourished. No distress.  HENT:  Head: Normocephalic and atraumatic.  Eyes: No scleral icterus.  Neck: Neck supple.  Cardiovascular: Normal rate.   Respiratory: Effort normal.  GI: Soft. She exhibits distension (minimal). There is no tenderness.  Large mid-line incision with staples noted.  Minimal erythema noted.  Genitourinary: Vaginal discharge (serosanguinous) found.  Cuff appears to be intact.  With probing of the cuff, there is a small 1 cm or less defect on the left side with fluid coming out of it.    MAU Course  Procedures   Assessment and Plan   Patient Active Problem List   Diagnosis Date Noted  . Ovarian cancer 11/12/2013  . Dehiscence of vaginal cuff 11/12/2013  . Left hip pain 09/26/2013  . Seasonal allergic rhinitis 08/14/2013  . History of migraine headaches 08/14/2013  . Exercise-induced asthma 08/14/2013  . Vitamin D deficiency 08/14/2013  . Osteopenia 08/14/2013  . Cholelithiasis 08/14/2013  . H/O herpes simplex infection 08/14/2013  . H/O bone density study 08/14/2013  . Benign neoplasm of skin of trunk, except scrotum-right upper back 02/18/2012   Likely small dehiscence of vaginal cuff.  Needs to be repaired surgically, so pt. Is not wet all the time and get skin breakdown.  She is returning to [facility] on Tuesday for staple removal and to make a decision about where to get chemo.  I think pt. Would best be served by having one physician group coordinate her care and obtaining follow-up.  Spoke to Dr. Uvaldo Bristle and she  has booked the pt. For surgical repair at 9 am. Procedure explained to pt.  Likely could go home after.  It is a very small opening and should be easy to repair. Tiernan Suto S 11/12/2013, 5:40 AM

## 2013-11-12 NOTE — MAU Note (Signed)
Had TAH BSO  omenectomy and radical debulking of peritoneum due to suspected ovarian CA. Surgery done at [facility]. Last night started having some watery d/c. Awoke about 0200 and was leaking a lot of pinkish watery d/c. Having usual postop pain but no new or diff pain.

## 2013-11-12 NOTE — Anesthesia Preprocedure Evaluation (Signed)
Anesthesia Evaluation  Patient identified by MRN, date of birth, ID band Patient awake    Reviewed: Allergy & Precautions, H&P , NPO status , Patient's Chart, lab work & pertinent test results  Airway Mallampati: I TM Distance: >3 FB Neck ROM: full    Dental no notable dental hx. (+) Teeth Intact   Pulmonary    Pulmonary exam normal       Cardiovascular negative cardio ROS      Neuro/Psych negative psych ROS   GI/Hepatic negative GI ROS, Neg liver ROS,   Endo/Other  negative endocrine ROS  Renal/GU negative Renal ROS     Musculoskeletal   Abdominal Normal abdominal exam  (+)   Peds  Hematology negative hematology ROS (+)   Anesthesia Other Findings   Reproductive/Obstetrics negative OB ROS                           Anesthesia Physical Anesthesia Plan  ASA: II  Anesthesia Plan: General   Post-op Pain Management:    Induction: Intravenous  Airway Management Planned: LMA  Additional Equipment:   Intra-op Plan:   Post-operative Plan:   Informed Consent: I have reviewed the patients History and Physical, chart, labs and discussed the procedure including the risks, benefits and alternatives for the proposed anesthesia with the patient or authorized representative who has indicated his/her understanding and acceptance.     Plan Discussed with: CRNA and Surgeon  Anesthesia Plan Comments:         Anesthesia Quick Evaluation

## 2013-11-12 NOTE — Discharge Instructions (Signed)
Call MD for T>100.4, heavy vaginal bleeding, severe abdominal pain, intractable nausea and/or vomiting, or respiratory distress.

## 2013-11-12 NOTE — MAU Note (Signed)
Patient to recovery to wait for OR call. Abx and consent sent with patient.

## 2013-11-12 NOTE — Anesthesia Procedure Notes (Signed)
Procedure Name: LMA Insertion Date/Time: 11/12/2013 9:21 AM Performed by: Flossie Dibble Pre-anesthesia Checklist: Patient identified, Timeout performed, Emergency Drugs available, Suction available and Patient being monitored Patient Re-evaluated:Patient Re-evaluated prior to inductionOxygen Delivery Method: Circle system utilized Preoxygenation: Pre-oxygenation with 100% oxygen Intubation Type: IV induction Ventilation: Mask ventilation without difficulty LMA: LMA inserted LMA Size: 4.0 Number of attempts: 1 Placement Confirmation: breath sounds checked- equal and bilateral and positive ETCO2 Tube secured with: Tape Dental Injury: Teeth and Oropharynx as per pre-operative assessment

## 2013-11-12 NOTE — Op Note (Signed)
PREOPERATIVE DIAGNOSIS:  53 y.o. with vaginal cuff dehiscence  POSTOPERATIVE DIAGNOSIS: The same  PROCEDURE: Exam under anesthesia and vaginal cuff repair  SURGEON:  Dr. Linda Hedges  INDICATIONS: 53 y.o. [id number]  here for scheduled surgery for repair of vaginal cuff.  She underwent TAH, BSO, and debulking for ovarian cancer at [facility] 8 days ago and had an uncomplicated postoperative course until last night when she had leakage of serosanguinous fluid.  She was evaluated in the MAU and found to have a small opening on the left side of the cuff where leakage was noted but no concern for evisceration.  Risks of surgery were discussed with the patient including but not limited to: bleeding which may require transfusion; infection which may require antibiotics; and other postoperative/anesthesia complications. Written informed consent was obtained.    FINDINGS:  Normal appearing cuff except the left side where a small gap is noted.  Probing with a uterine sound reveals an approximately 22mm gap between stitches.  Serosanguinous leakage noted from this location.  No leakage or gap at the conclusion of the case.  ANESTHESIA:   General  ESTIMATED BLOOD LOSS:  Less than 20 ml  SPECIMENS: None  COMPLICATIONS:  None immediate.  PROCEDURE DETAILS:  The patient received intravenous antibiotics while in the preoperative area.  She was then taken to the operating room where general anesthesia was administered and was found to be adequate.  After an adequate timeout was performed, she was placed in the dorsal lithotomy position and prepped and draped in the sterile manner.   Her bladder was catheterized for an unmeasured amount of clear, yellow urine.  A weighted speculum was placed in the posterior fornix and a Deaver retractor placed anteriorly.  The cuff was examined with the above findings.  Four 0-Monocryl figure-of-eight stitches were placed from the midline to the left angle following identification  of cuff closure gaps using the uterine sound.  The vagina was copiously irrigated.  Sponge, lap and needle counts were correct.  The patient tolerated the procedure well.

## 2013-11-12 NOTE — Transfer of Care (Signed)
Immediate Anesthesia Transfer of Care Note  Patient: Mallory Huber  Procedure(s) Performed: Procedure(s): REPAIR VAGINAL CUFF (N/A)  Patient Location: PACU  Anesthesia Type:General  Level of Consciousness: awake, alert  and oriented  Airway & Oxygen Therapy: Patient Spontanous Breathing and Patient connected to nasal cannula oxygen  Post-op Assessment: Report given to PACU RN and Post -op Vital signs reviewed and stable  Post vital signs: Reviewed and stable  Complications: No apparent anesthesia complications

## 2013-11-13 ENCOUNTER — Other Ambulatory Visit: Payer: Self-pay | Admitting: *Deleted

## 2013-11-13 ENCOUNTER — Encounter (HOSPITAL_COMMUNITY): Payer: Self-pay | Admitting: Obstetrics & Gynecology

## 2013-11-13 ENCOUNTER — Telehealth: Payer: Self-pay | Admitting: *Deleted

## 2013-11-13 NOTE — Telephone Encounter (Signed)
error 

## 2013-11-13 NOTE — ED Provider Notes (Signed)
Medical screening examination/treatment/procedure(s) were performed by non-physician practitioner and as supervising physician I was immediately available for consultation/collaboration.  EKG Interpretation   None        Merryl Hacker, MD 11/13/13 1700

## 2013-11-16 ENCOUNTER — Ambulatory Visit (INDEPENDENT_AMBULATORY_CARE_PROVIDER_SITE_OTHER): Payer: Managed Care, Other (non HMO) | Admitting: General Surgery

## 2014-03-09 ENCOUNTER — Other Ambulatory Visit: Payer: Self-pay | Admitting: *Deleted

## 2014-03-09 MED ORDER — INTEGRA 62.5-62.5-40-3 MG PO CAPS: 1.0000 | ORAL_CAPSULE | Freq: Every day | ORAL | Status: AC

## 2014-03-09 NOTE — Telephone Encounter (Signed)
Refill request

## 2014-04-23 ENCOUNTER — Other Ambulatory Visit (HOSPITAL_COMMUNITY): Payer: Self-pay | Admitting: Obstetrics and Gynecology

## 2014-04-23 DIAGNOSIS — Z1231 Encounter for screening mammogram for malignant neoplasm of breast: Secondary | ICD-10-CM

## 2014-05-08 ENCOUNTER — Ambulatory Visit (HOSPITAL_COMMUNITY)
Admission: RE | Admit: 2014-05-08 | Discharge: 2014-05-08 | Disposition: A | Discharge status: Home / Self Care | Payer: BC Managed Care – PPO | Source: Ambulatory Visit | Attending: Obstetrics and Gynecology | Admitting: Obstetrics and Gynecology

## 2014-05-08 DIAGNOSIS — Z1231 Encounter for screening mammogram for malignant neoplasm of breast: Secondary | ICD-10-CM | POA: Insufficient documentation

## 2014-05-08 DIAGNOSIS — C569 Malignant neoplasm of unspecified ovary: Secondary | ICD-10-CM | POA: Insufficient documentation

## 2014-05-22 ENCOUNTER — Other Ambulatory Visit (HOSPITAL_COMMUNITY): Payer: Self-pay | Admitting: Obstetrics and Gynecology

## 2014-05-22 DIAGNOSIS — C569 Malignant neoplasm of unspecified ovary: Secondary | ICD-10-CM

## 2014-08-15 ENCOUNTER — Ambulatory Visit (HOSPITAL_COMMUNITY): Payer: BC Managed Care – PPO

## 2014-09-10 ENCOUNTER — Encounter (HOSPITAL_COMMUNITY): Payer: Self-pay | Admitting: Obstetrics & Gynecology

## 2014-09-11 ENCOUNTER — Ambulatory Visit: Payer: BC Managed Care – PPO | Admitting: Internal Medicine

## 2014-09-18 ENCOUNTER — Encounter: Payer: Self-pay | Admitting: Internal Medicine

## 2014-09-18 ENCOUNTER — Ambulatory Visit (INDEPENDENT_AMBULATORY_CARE_PROVIDER_SITE_OTHER): Payer: BC Managed Care – PPO | Admitting: Internal Medicine

## 2014-09-18 VITALS — BP 105/70 | HR 76 | Temp 98.5°F | Resp 16 | Ht 63.0 in | Wt 151.0 lb

## 2014-09-18 DIAGNOSIS — J45909 Unspecified asthma, uncomplicated: Secondary | ICD-10-CM | POA: Diagnosis not present

## 2014-09-18 DIAGNOSIS — C569 Malignant neoplasm of unspecified ovary: Secondary | ICD-10-CM

## 2014-09-18 LAB — COMPREHENSIVE METABOLIC PANEL
ALK PHOS: 58 U/L (ref 39–117)
ALT: 15 U/L (ref 0–35)
AST: 18 U/L (ref 0–37)
Albumin: 4.6 g/dL (ref 3.5–5.2)
BUN: 10 mg/dL (ref 6–23)
CO2: 29 meq/L (ref 19–32)
Calcium: 10.3 mg/dL (ref 8.4–10.5)
Chloride: 101 mEq/L (ref 96–112)
Creat: 0.67 mg/dL (ref 0.50–1.10)
Glucose, Bld: 101 mg/dL — ABNORMAL HIGH (ref 70–99)
Potassium: 4.7 mEq/L (ref 3.5–5.3)
SODIUM: 139 meq/L (ref 135–145)
TOTAL PROTEIN: 7.2 g/dL (ref 6.0–8.3)
Total Bilirubin: 0.6 mg/dL (ref 0.2–1.2)

## 2014-09-18 LAB — CBC WITH DIFFERENTIAL/PLATELET
Basophils Absolute: 0 10*3/uL (ref 0.0–0.1)
Basophils Relative: 1 % (ref 0–1)
Eosinophils Absolute: 0.2 10*3/uL (ref 0.0–0.7)
Eosinophils Relative: 4 % (ref 0–5)
HCT: 35.8 % — ABNORMAL LOW (ref 36.0–46.0)
Hemoglobin: 12.2 g/dL (ref 12.0–15.0)
LYMPHS ABS: 1.1 10*3/uL (ref 0.7–4.0)
Lymphocytes Relative: 28 % (ref 12–46)
MCH: 32.3 pg (ref 26.0–34.0)
MCHC: 34.1 g/dL (ref 30.0–36.0)
MCV: 94.7 fL (ref 78.0–100.0)
Monocytes Absolute: 0.3 10*3/uL (ref 0.1–1.0)
Monocytes Relative: 7 % (ref 3–12)
NEUTROS PCT: 60 % (ref 43–77)
Neutro Abs: 2.3 10*3/uL (ref 1.7–7.7)
Platelets: 265 10*3/uL (ref 150–400)
RBC: 3.78 MIL/uL — AB (ref 3.87–5.11)
RDW: 13.1 % (ref 11.5–15.5)
WBC: 3.8 10*3/uL — ABNORMAL LOW (ref 4.0–10.5)

## 2014-09-18 LAB — LIPID PANEL
CHOL/HDL RATIO: 2.3 ratio
CHOLESTEROL: 189 mg/dL (ref 0–200)
HDL: 83 mg/dL (ref >39)
LDL CALC: 94 mg/dL (ref 0–99)
Triglycerides: 58 mg/dL (ref <150)
VLDL: 12 mg/dL (ref 0–40)

## 2014-09-18 LAB — FOLATE: Folate: >20 ng/mL

## 2014-09-18 LAB — VITAMIN B12: VITAMIN B 12: 383 pg/mL (ref 211–911)

## 2014-09-18 LAB — TSH: TSH: 1.552 u[IU]/mL (ref 0.350–4.500)

## 2014-09-18 MED ORDER — FLUTICASONE PROPIONATE 50 MCG/ACT NA SUSP: 1.0000 | Freq: Every day | NASAL | Status: AC | PRN

## 2014-09-18 MED ORDER — ACYCLOVIR 400 MG PO TABS: ORAL_TABLET | ORAL | Status: AC

## 2014-09-18 MED ORDER — ALBUTEROL SULFATE HFA 108 (90 BASE) MCG/ACT IN AERS: 2.0000 | INHALATION_SPRAY | Freq: Four times a day (QID) | RESPIRATORY_TRACT | Status: AC | PRN

## 2014-09-18 NOTE — Patient Instructions (Signed)
Give pt number to Radiology Dept at [facility] to schedule her Dexa   See me as needed  To lab today

## 2014-09-18 NOTE — Progress Notes (Signed)
Subjective:    Patient ID: Mallory Huber, female    DOB: September 08, 1961, 53 y.o.   MRN: [id number]  HPI 08/2013 Allergic rhinitis Will re-order flonase  Dizziness: Clinical picture consistant with BPV Neurologic exam non focal. Discussed vestibular rehab and Epley maneuver. If recurs she is to see me in office. She does Not wish to see rehab at this point as sympotms have resolved.   Vitamin D deficiency will obtain today  Exercise induced asthma Continue prn albuterol  History of migraines Prn NSAId for ne  cholelihiasis  todays' note  Mallory Huber returns for follow up.  Since her last visit she has been diagnosed with Stage III B ovarian cancer.    Presented with abd ascites.  She had debulking surgery , TAH with BSO  Cholecystectomy and appendectomy at [facility] Dr. Marlaine Hind.  This was followed by IV and intra-peritoneal CTX which she completed this May.   Last Pet scan  Negative  and CA 125 normal.    She is doing well  And is back at work 3 days a week  She has not needed her albuterol in several months.  No recent wheezng.  She did have her influenza vaccine  Her colonoscopy is due this month    She needs a Dexa as well  No Known Allergies Past Medical History  Diagnosis Date  . Lipoma     on back  . Migraine   . Asthma with exacerbation   . Ovarian cancer    Past Surgical History  Procedure Laterality Date  . Cesarean section  86, 89, 90  . Lipoma excision    . Abdominal hysterectomy    . Bilateral oophorectomy  11/04/2013  . Bilateral salpingectomy  11/04/2013  . Omentectomy  11/04/2013  . Repair vaginal cuff N/A 11/12/2013    Procedure: REPAIR VAGINAL CUFF;  Surgeon: Linda Hedges, DO;  Location: [facility];  Service: Gynecology;  Laterality: N/A;   History   Social History  . Marital Status: Married    Spouse Name: N/A    Number of Children: N/A  . Years of Education: N/A   Occupational History  . Not on file.   Social History Main Topics  .  Smoking status: Never Smoker   . Smokeless tobacco: Never Used  . Alcohol Use: Yes     Comment: occ  . Drug Use: No  . Sexual Activity: Yes   Other Topics Concern  . Not on file   Social History Narrative   Family History  Problem Relation Age of Onset  . Hypertension Mother   . Hypertension Father   . Cancer Maternal Aunt     breast  . Hyperlipidemia Neg Hx   . Heart attack Neg Hx   . Diabetes Neg Hx   . Sudden death Neg Hx    Patient Active Problem List   Diagnosis Date Noted  . Ovarian cancer 11/12/2013  . Dehiscence of vaginal cuff 11/12/2013  . Left hip pain 09/26/2013  . Seasonal allergic rhinitis 08/14/2013  . History of migraine headaches 08/14/2013  . Exercise-induced asthma 08/14/2013  . Vitamin D deficiency 08/14/2013  . Osteopenia 08/14/2013  . Cholelithiasis 08/14/2013  . H/O herpes simplex infection 08/14/2013  . H/O bone density study 08/14/2013  . Benign neoplasm of skin of trunk, except scrotum-right upper back 02/18/2012   Current Outpatient Prescriptions on File Prior to Visit  Medication Sig Dispense Refill  . albuterol (PROVENTIL HFA;VENTOLIN HFA) 108 (90 BASE) MCG/ACT inhaler Inhale 2 puffs  into the lungs every 6 (six) hours as needed for wheezing. 1 Inhaler 1  . Calcium Carbonate-Vitamin D (CALCIUM + D PO) Take 1 tablet by mouth daily.     . cephALEXin (KEFLEX) 500 MG capsule Take 500 mg by mouth 4 (four) times daily.    Marland Kitchen docusate sodium (COLACE) 100 MG capsule Take 100 mg by mouth 2 (two) times daily.    Marland Kitchen enoxaparin (LOVENOX) 40 MG/0.4ML injection Inject 40 mg into the skin daily.    . Fe Fum-FePoly-Vit C-Vit B3 (INTEGRA) 62.5-62.5-40-3 MG CAPS Take 1 capsule by mouth daily. Take one daily 90 capsule 0  . fexofenadine (ALLEGRA) 30 MG tablet Take 30 mg by mouth daily.    . fluticasone (FLONASE) 50 MCG/ACT nasal spray Place 1 spray into the nose daily as needed for allergies or rhinitis. Use one spray in each nostril daily for 2 weeks    .  Glucos-MSM-C-Mn-Ginger-Willow (GLUCOSAMINE MSM COMPLEX PO) Take 2 tablets by mouth daily.     . Melatonin 5 MG TABS Take 5 mg by mouth at bedtime as needed (sleep).     . Multiple Vitamin (MULTIVITAMIN) capsule Take 1 capsule by mouth daily.    . Omega-3 Fatty Acids (FISH OIL) 1200 MG CPDR Take 2,400 mg by mouth daily.    Marland Kitchen oxyCODONE-acetaminophen (PERCOCET) 10-325 MG per tablet Take 1-2 tablets by mouth every 4 (four) hours as needed for pain (depends on pain level if takes 1 or 2 tablets).     . Simethicone (PHAZYME) 180 MG CAPS Take 2 capsules by mouth once.     No current facility-administered medications on file prior to visit.       Review of Systems See HPI    Objective:   Physical Exam  Physical Exam  Nursing note and vitals reviewed. Peak flow 325 Constitutional: She is oriented to person, place, and time. She appears well-developed and well-nourished.  HENT:  Head: Normocephalic and atraumatic.  Cardiovascular: Normal rate and regular rhythm. Exam reveals no gallop and no friction rub.  No murmur heard.  Pulmonary/Chest: Breath sounds normal. She has no wheezes. She has no rales.  Neurological: She is alert and oriented to person, place, and time.  Skin: Skin is warm and dry.  Psychiatric: She has a normal mood and affect. Her behavior is normal.             Assessment & Plan:  Asthma    Continue  Albuterol prn  Allergic rhinitis  Continue flonase  Ovarian CA  Managed [facility]  Completed CTX 03/2014  HSV  Ok for acyclovir    Will need Gi referral   Labs today  She had flu vaccine

## 2014-09-19 ENCOUNTER — Encounter: Payer: Self-pay | Admitting: Internal Medicine

## 2014-09-19 ENCOUNTER — Ambulatory Visit (HOSPITAL_COMMUNITY): Payer: BC Managed Care – PPO

## 2014-09-19 LAB — VITAMIN D 25 HYDROXY (VIT D DEFICIENCY, FRACTURES): VIT D 25 HYDROXY: 56 ng/mL (ref 30–89)

## 2014-09-20 ENCOUNTER — Other Ambulatory Visit: Payer: Self-pay | Admitting: *Deleted

## 2014-09-22 ENCOUNTER — Encounter: Payer: Self-pay | Admitting: Internal Medicine

## 2014-09-22 DIAGNOSIS — C5700 Malignant neoplasm of unspecified fallopian tube: Secondary | ICD-10-CM | POA: Insufficient documentation

## 2014-09-24 ENCOUNTER — Ambulatory Visit (HOSPITAL_COMMUNITY)
Admission: RE | Admit: 2014-09-24 | Discharge: 2014-09-24 | Disposition: A | Discharge status: Home / Self Care | Payer: BC Managed Care – PPO | Source: Ambulatory Visit | Attending: Internal Medicine | Admitting: Internal Medicine

## 2014-09-24 DIAGNOSIS — C569 Malignant neoplasm of unspecified ovary: Secondary | ICD-10-CM

## 2014-09-24 DIAGNOSIS — Z780 Asymptomatic menopausal state: Secondary | ICD-10-CM | POA: Diagnosis not present

## 2014-09-24 DIAGNOSIS — Z13820 Encounter for screening for osteoporosis: Secondary | ICD-10-CM | POA: Diagnosis not present

## 2014-10-02 ENCOUNTER — Encounter: Payer: Self-pay | Admitting: *Deleted

## 2014-10-02 NOTE — Progress Notes (Signed)
Mallory Huber is aware of her Bone Density results-eh

## 2014-10-05 ENCOUNTER — Encounter: Payer: Self-pay | Admitting: Internal Medicine

## 2015-06-03 ENCOUNTER — Other Ambulatory Visit (HOSPITAL_COMMUNITY): Payer: Self-pay | Admitting: Obstetrics and Gynecology

## 2015-06-03 DIAGNOSIS — Z1231 Encounter for screening mammogram for malignant neoplasm of breast: Secondary | ICD-10-CM

## 2015-06-11 ENCOUNTER — Ambulatory Visit (HOSPITAL_COMMUNITY)
Admission: RE | Admit: 2015-06-11 | Discharge: 2015-06-11 | Disposition: A | Discharge status: Home / Self Care | Payer: BLUE CROSS/BLUE SHIELD | Source: Ambulatory Visit | Attending: Obstetrics and Gynecology | Admitting: Obstetrics and Gynecology

## 2015-06-11 DIAGNOSIS — Z1231 Encounter for screening mammogram for malignant neoplasm of breast: Secondary | ICD-10-CM | POA: Insufficient documentation

## 2016-08-24 ENCOUNTER — Other Ambulatory Visit: Payer: Self-pay | Admitting: Internal Medicine

## 2016-08-24 DIAGNOSIS — Z1231 Encounter for screening mammogram for malignant neoplasm of breast: Secondary | ICD-10-CM

## 2016-08-26 ENCOUNTER — Ambulatory Visit
Admission: RE | Admit: 2016-08-26 | Discharge: 2016-08-26 | Disposition: A | Discharge status: Home / Self Care | Payer: BLUE CROSS/BLUE SHIELD | Source: Ambulatory Visit | Attending: Internal Medicine | Admitting: Internal Medicine

## 2016-08-26 DIAGNOSIS — Z1231 Encounter for screening mammogram for malignant neoplasm of breast: Secondary | ICD-10-CM

## 2017-05-18 MED ORDER — DEXAMETHASONE 4 MG TABLET
ORAL_TABLET | 6 refills | 0 days | Status: CP
Start: 2017-05-18 — End: 2018-01-03

## 2017-05-25 ENCOUNTER — Ambulatory Visit
Admission: RE | Admit: 2017-05-25 | Discharge: 2017-05-25 | Disposition: A | Discharge status: Home / Self Care | Payer: BC Managed Care – PPO

## 2017-05-25 DIAGNOSIS — R0600 Dyspnea, unspecified: Principal | ICD-10-CM

## 2017-05-25 DIAGNOSIS — C5700 Malignant neoplasm of unspecified fallopian tube: Secondary | ICD-10-CM

## 2017-05-25 DIAGNOSIS — Z9889 Other specified postprocedural states: Principal | ICD-10-CM

## 2017-05-25 MED ORDER — FUROSEMIDE 20 MG TABLET
ORAL_TABLET | Freq: Every day | ORAL | 4 refills | 0 days | Status: SS
Start: 2017-05-25 — End: 2018-02-08

## 2017-05-28 MED ORDER — FAMOTIDINE 40 MG TABLET
ORAL_TABLET | Freq: Every evening | ORAL | 2 refills | 0 days | Status: CP
Start: 2017-05-28 — End: 2017-09-13

## 2017-06-09 ENCOUNTER — Ambulatory Visit
Admission: RE | Admit: 2017-06-09 | Discharge: 2017-06-15 | Disposition: A | Discharge status: Home / Self Care | Payer: BC Managed Care – PPO | Attending: Rehabilitative and Restorative Service Providers" | Admitting: Rehabilitative and Restorative Service Providers"

## 2017-06-09 DIAGNOSIS — I890 Lymphedema, not elsewhere classified: Secondary | ICD-10-CM

## 2017-06-09 DIAGNOSIS — C5700 Malignant neoplasm of unspecified fallopian tube: Principal | ICD-10-CM

## 2017-06-16 ENCOUNTER — Ambulatory Visit
Admission: RE | Admit: 2017-06-16 | Discharge: 2017-06-16 | Disposition: A | Discharge status: Home / Self Care | Payer: BC Managed Care – PPO

## 2017-06-16 DIAGNOSIS — E039 Hypothyroidism, unspecified: Principal | ICD-10-CM

## 2017-06-16 DIAGNOSIS — C5700 Malignant neoplasm of unspecified fallopian tube: Secondary | ICD-10-CM

## 2017-06-18 ENCOUNTER — Ambulatory Visit
Admission: RE | Admit: 2017-06-18 | Discharge: 2017-06-18 | Disposition: A | Discharge status: Home / Self Care | Payer: BC Managed Care – PPO | Attending: Gynecologic Oncology | Admitting: Gynecologic Oncology

## 2017-06-18 ENCOUNTER — Ambulatory Visit
Admission: RE | Admit: 2017-06-18 | Discharge: 2017-06-18 | Disposition: A | Discharge status: Home / Self Care | Payer: BC Managed Care – PPO

## 2017-06-18 DIAGNOSIS — Z9889 Other specified postprocedural states: Secondary | ICD-10-CM

## 2017-06-18 DIAGNOSIS — C5700 Malignant neoplasm of unspecified fallopian tube: Principal | ICD-10-CM

## 2017-06-18 MED ORDER — LEVOTHYROXINE 112 MCG TABLET
ORAL_TABLET | Freq: Every day | ORAL | 3 refills | 0.00000 days | Status: CP
Start: 2017-06-18 — End: 2017-08-18

## 2017-07-08 ENCOUNTER — Ambulatory Visit
Admission: RE | Admit: 2017-07-08 | Discharge: 2017-07-08 | Disposition: A | Discharge status: Home / Self Care | Payer: BC Managed Care – PPO

## 2017-07-08 DIAGNOSIS — C5700 Malignant neoplasm of unspecified fallopian tube: Principal | ICD-10-CM

## 2017-07-08 DIAGNOSIS — Z9889 Other specified postprocedural states: Secondary | ICD-10-CM

## 2017-07-21 ENCOUNTER — Ambulatory Visit
Admission: RE | Admit: 2017-07-21 | Discharge: 2017-08-14 | Disposition: A | Discharge status: Home / Self Care | Payer: BC Managed Care – PPO | Attending: Rehabilitative and Restorative Service Providers" | Admitting: Rehabilitative and Restorative Service Providers"

## 2017-07-21 DIAGNOSIS — I890 Lymphedema, not elsewhere classified: Principal | ICD-10-CM

## 2017-07-28 DIAGNOSIS — I890 Lymphedema, not elsewhere classified: Principal | ICD-10-CM

## 2017-07-29 ENCOUNTER — Ambulatory Visit
Admission: RE | Admit: 2017-07-29 | Discharge: 2017-07-29 | Disposition: A | Discharge status: Home / Self Care | Payer: BC Managed Care – PPO | Attending: Nurse Practitioner | Admitting: Nurse Practitioner

## 2017-07-29 DIAGNOSIS — C5700 Malignant neoplasm of unspecified fallopian tube: Principal | ICD-10-CM

## 2017-07-29 DIAGNOSIS — Z9889 Other specified postprocedural states: Secondary | ICD-10-CM

## 2017-08-11 DIAGNOSIS — I890 Lymphedema, not elsewhere classified: Principal | ICD-10-CM

## 2017-08-17 ENCOUNTER — Ambulatory Visit
Admission: RE | Admit: 2017-08-17 | Discharge: 2017-08-17 | Disposition: A | Discharge status: Home / Self Care | Payer: BC Managed Care – PPO

## 2017-08-17 DIAGNOSIS — E039 Hypothyroidism, unspecified: Principal | ICD-10-CM

## 2017-08-17 DIAGNOSIS — C5700 Malignant neoplasm of unspecified fallopian tube: Secondary | ICD-10-CM

## 2017-08-18 MED ORDER — LEVOTHYROXINE 125 MCG TABLET
ORAL_TABLET | Freq: Every day | ORAL | 3 refills | 0 days | Status: CP
Start: 2017-08-18 — End: 2018-08-18

## 2017-08-20 ENCOUNTER — Ambulatory Visit
Admission: RE | Admit: 2017-08-20 | Discharge: 2017-08-20 | Disposition: A | Discharge status: Home / Self Care | Payer: BC Managed Care – PPO | Attending: Gynecologic Oncology | Admitting: Gynecologic Oncology

## 2017-08-20 ENCOUNTER — Ambulatory Visit
Admission: RE | Admit: 2017-08-20 | Discharge: 2017-08-20 | Disposition: A | Discharge status: Home / Self Care | Payer: BC Managed Care – PPO

## 2017-08-20 DIAGNOSIS — Z9889 Other specified postprocedural states: Principal | ICD-10-CM

## 2017-08-20 DIAGNOSIS — C5700 Malignant neoplasm of unspecified fallopian tube: Secondary | ICD-10-CM

## 2017-08-27 ENCOUNTER — Ambulatory Visit
Admission: RE | Admit: 2017-08-27 | Discharge: 2017-09-13 | Disposition: A | Discharge status: Home / Self Care | Payer: BC Managed Care – PPO | Attending: Rehabilitative and Restorative Service Providers" | Admitting: Rehabilitative and Restorative Service Providers"

## 2017-08-27 DIAGNOSIS — I890 Lymphedema, not elsewhere classified: Principal | ICD-10-CM

## 2017-08-31 DIAGNOSIS — C801 Malignant (primary) neoplasm, unspecified: Principal | ICD-10-CM

## 2017-09-06 ENCOUNTER — Ambulatory Visit
Admission: RE | Admit: 2017-09-06 | Discharge: 2017-09-06 | Disposition: A | Discharge status: Home / Self Care | Payer: BC Managed Care – PPO

## 2017-09-06 DIAGNOSIS — C5700 Malignant neoplasm of unspecified fallopian tube: Principal | ICD-10-CM

## 2017-09-09 ENCOUNTER — Ambulatory Visit
Admission: RE | Admit: 2017-09-09 | Discharge: 2017-09-09 | Payer: BC Managed Care – PPO | Attending: Gynecologic Oncology | Admitting: Gynecologic Oncology

## 2017-09-09 DIAGNOSIS — C5700 Malignant neoplasm of unspecified fallopian tube: Principal | ICD-10-CM

## 2017-09-13 DIAGNOSIS — I890 Lymphedema, not elsewhere classified: Principal | ICD-10-CM

## 2017-09-13 MED ORDER — CYCLOPHOSPHAMIDE 50 MG CAPSULE: 50 mg | capsule | Freq: Every day | 5 refills | 0 days | Status: AC

## 2017-09-13 MED ORDER — FAMOTIDINE 40 MG TABLET
ORAL_TABLET | Freq: Every evening | ORAL | 3 refills | 0 days | Status: SS
Start: 2017-09-13 — End: 2018-02-08

## 2017-09-13 MED ORDER — CYCLOPHOSPHAMIDE 50 MG CAPSULE
ORAL_CAPSULE | Freq: Every day | ORAL | 5 refills | 0.00000 days | Status: CP
Start: 2017-09-13 — End: 2017-09-13

## 2017-09-13 NOTE — Unmapped (Signed)
OUTPATIENT PHYSICAL THERAPY   Lower Quadrant Lymphedema  PROGRESS  NOTE    Date:09/13/2017    Patient Name: Anita Jones  Date of Birth:January 22, 1961  Session #: 6  Diagnosis:   Encounter Diagnosis   Name Primary?   ??? Lymphedema Yes     Onset:: ??Two weeks ago/ 05-23-17  Referring Provider: Yehuda Savannah , MD  Initial Evaluation and POC certification : 06-09-17   ??  ??  ??  Contraindications:  none  ??  Precautions/Red Flags:  asthma  history of cancer and with mets  ??    ASSESSMENT:    Patient demonstrating decreased limb volumes bilaterally, abdominal swelling decreasing, genital swelling decreasing.         Goals Achieved:       Skin soft  Pt is independent in the  knowledge of proper skin care and lymphedema risk precautions to reduce risk of infection.  Patient has properly demonstrated self-manual lymphatic drainage x1 in clinic to assist in management of lymphedema swelling  Pt. can  independently don/doff and tolerate  compression garment or compression bandages x1 in clinic  to ensure independent management of lymphedema.  Patient has demonstrated an understanding of proper posture  to minimize pain and tightness in the affected quadrant.           Continued/New Goals:  In 3 months:    Pt will be independent with all self care related to lymphedema.  Patient's  Bilateral lower extremity and abdominal/genital swelling  will decrease with Flexitouch use.           PLAN:   Within a 90 day certification period, schedule permitting, decreasing frequency as able, patient will participate in the following as needed: Manual lymphatic drainage, compression bandaging or use of reduction kits, appropriate garment recommendations, skin care and therapeutic exercise, manual therapy, low level laser therapy, ROM and strength training, education on posture and body mechanics, taping, and pump/ equipment  recommendations     Planned frequency and duration  of treatment:   Evaluate in 3-4 weeks to check Flexitouch effectiveness. SUBJECTIVE :  Patient???s communication preference: verbal, written, visual,prn  History of Present Illness/ Pt reports:   Starting a new med with MD Dareen Piano, swelling feels a lot better     Location of pain: No pain      Medical hx/conditions/surgical procedures,medications, allergies: reviewed    OBJECTIVE:    Tests/Measures:  Current Functional Limitations: The patient reports the following limitations based on verbal description performing heavy activities around the home      Observations: Pt arrived wearing appropriate compression garments:  yes      Skin soft both legs, and abdominal region  ROM [facility]    Girth Measurements:      hiop 100.0 cm, navel 92.0 cm, natural waistline 87.0 cm      Total Treatment Time: 53 min      Treatment Rendered:      Self Care x 8 min   Patient Education:Pt was educated regarding the following:  lymphedema etiology  HEP  importance of therapy  treatment options and plan  skin care  lymphedema precautions  self-manual lymphatic drainage  equipment recommendations  garment options  Indications/Contraindications to treatment  Community Resources     Patient demonstrated and verbalized agreement and understanding.   Given written information as needed.    Manual x 45 min   Manual lymphatic drainage using appropriate anastomosies bilateral LE, abdomen, and genital  Therapeutic Exercise x 0 min  na             Communication/consultation with other professionals:  N/A    Equipment provided/recommended:   N/A    Today's Charges:                      I attest that I have reviewed the above information.  Signed: Clementeen Graham, PT   09/13/2017 12:57 PM

## 2017-09-13 NOTE — Unmapped (Signed)
[facility] Specialty Medication Referral: No PA required    Medication (Brand/Generic): Cyclophosphamide    Initial FSI Test Claim completed with resulted information below:  No PA required  Patient ABLE to fill at [facility]  Insurance Company:  [employer]  Anticipated Copay: $0    As Co-pay is under $100 defined limit, per policy there will be no further investigation of need for financial assistance at this time unless patient requests. This referral has been communicated to the provider and handed off to the [facility] team for further processing and filling of prescribed medication.   ______________________________________________________________________  Please utilize this referral for viewing purposes as it will serve as the central location for all relevant documentation and updates.

## 2017-09-14 MED ORDER — CYCLOPHOSPHAMIDE 50 MG CAPSULE
ORAL_CAPSULE | Freq: Every day | ORAL | 0 refills | 0.00000 days | Status: CP
Start: 2017-09-14 — End: 2017-09-14

## 2017-09-14 MED ORDER — CYCLOPHOSPHAMIDE 50 MG CAPSULE: capsule | 0 refills | 0 days

## 2017-09-14 MED FILL — CYCLOPHOSPHAMIDE/50MG/CAPS: CYCLOPHOSPHAMIDE/50MG/CAPS | 30 days supply | Qty: 30 | Fill #0

## 2017-09-15 ENCOUNTER — Other Ambulatory Visit: Payer: Self-pay | Admitting: Internal Medicine

## 2017-09-15 DIAGNOSIS — Z1231 Encounter for screening mammogram for malignant neoplasm of breast: Secondary | ICD-10-CM

## 2017-09-16 ENCOUNTER — Ambulatory Visit
Admission: RE | Admit: 2017-09-16 | Discharge: 2017-09-16 | Disposition: A | Discharge status: Home / Self Care | Payer: BC Managed Care – PPO

## 2017-09-16 DIAGNOSIS — Z9889 Other specified postprocedural states: Principal | ICD-10-CM

## 2017-09-16 DIAGNOSIS — C5700 Malignant neoplasm of unspecified fallopian tube: Secondary | ICD-10-CM

## 2017-09-16 LAB — CBC W/ AUTO DIFF
BASOPHILS ABSOLUTE COUNT: 0 10*9/L (ref 0.0–0.1)
EOSINOPHILS ABSOLUTE COUNT: 0.1 10*9/L (ref 0.0–0.4)
HEMOGLOBIN: 9 g/dL — ABNORMAL LOW (ref 12.0–16.0)
LARGE UNSTAINED CELLS: 3 % (ref 0–4)
LYMPHOCYTES ABSOLUTE COUNT: 0.6 10*9/L — ABNORMAL LOW (ref 1.5–5.0)
MEAN CORPUSCULAR HEMOGLOBIN CONC: 32.3 g/dL (ref 31.0–37.0)
MEAN CORPUSCULAR HEMOGLOBIN: 34.1 pg — ABNORMAL HIGH (ref 26.0–34.0)
MEAN CORPUSCULAR VOLUME: 105.6 fL — ABNORMAL HIGH (ref 80.0–100.0)
MEAN PLATELET VOLUME: 8.6 fL (ref 7.0–10.0)
MONOCYTES ABSOLUTE COUNT: 0.2 10*9/L (ref 0.2–0.8)
NEUTROPHILS ABSOLUTE COUNT: 2.9 10*9/L (ref 2.0–7.5)
PLATELET COUNT: 203 10*9/L (ref 150–440)
RED BLOOD CELL COUNT: 2.63 10*12/L — ABNORMAL LOW (ref 4.00–5.20)
RED CELL DISTRIBUTION WIDTH: 16.9 % — ABNORMAL HIGH (ref 12.0–15.0)
WBC ADJUSTED: 3.9 10*9/L — ABNORMAL LOW (ref 4.5–11.0)

## 2017-09-16 LAB — SMEAR REVIEW

## 2017-09-16 LAB — ANISOCYTOSIS

## 2017-09-17 NOTE — Unmapped (Signed)
Patient Information: Anita Jones is a 56 y.o. year-old female with recurrent fallopian tube cancer who I am counseling today on oral cyclophoshphamide.     Pharmacy: [facility]    Confirmed Address and Phone number: Yes    PMH: reviewed    Current medications: reviewed    Allergies: reviewed    Cultural assessment:   -Relevant cultural assessment and health literacy was completed.  Patient is able to store his/her medication as directed  -This patient does not have any cognitive or physical disabilities   -This patient does not speak any other languages.      Education points:  1. Adherence to oral chemotherapy was discussed. If the patient is having difficulty remembering to take her dose there are a number of strategies that we can use in order to help.     2. Food/drug Considerations: Cyclophosphamide  can be taken with or without food. Cyclophosphamide should be taken in the morning to allow for adequate hydration throughout the day.    3. The following adverse effects were discussed, including related supportive care recommendations and emergency situations:        A. Leukopenia   B. Thrombocytopenia   C. Anemia   D. Nausea/Vomiting   E. Fatigue   F. Hemorrhagic cystitis    4. Drug Drug Interactions:  Tumeric was recommended to discontinue given controversial data regarding it effecting cyclophosphamide cytotoxic ability.      Plan:    F/u: Will follow up with the patient in 3 weeks prior to next bevacizumab infusion.    I spent 15 minutes in direct patient care.    Hermelinda Medicus, PharmD, BCOP, CPP  Gynecologic Oncology Clinic Pharmacist  Pager: [phone]

## 2017-09-22 ENCOUNTER — Ambulatory Visit
Admission: RE | Admit: 2017-09-22 | Discharge: 2017-09-22 | Disposition: A | Discharge status: Home / Self Care | Payer: BLUE CROSS/BLUE SHIELD | Source: Ambulatory Visit | Attending: Internal Medicine | Admitting: Internal Medicine

## 2017-09-22 DIAGNOSIS — Z1231 Encounter for screening mammogram for malignant neoplasm of breast: Secondary | ICD-10-CM

## 2017-10-05 ENCOUNTER — Ambulatory Visit
Admission: RE | Admit: 2017-10-05 | Discharge: 2017-10-05 | Disposition: A | Discharge status: Home / Self Care | Payer: BC Managed Care – PPO

## 2017-10-05 DIAGNOSIS — C5700 Malignant neoplasm of unspecified fallopian tube: Secondary | ICD-10-CM

## 2017-10-05 DIAGNOSIS — Z9889 Other specified postprocedural states: Principal | ICD-10-CM

## 2017-10-05 LAB — CBC W/ DIFFERENTIAL
CREATININE: 0.53 mg/dL
HEMATOCRIT: 27.6 %
HEMOGLOBIN: 9.1 g/dL
MAGNESIUM: 1.6 mg/dL
NEUTROPHILS ABSOLUTE COUNT: 0.9 10*9/L
PLATELET COUNT: 263 10*9/L
WHITE BLOOD CELL COUNT: 4.1 10*9/L

## 2017-10-05 LAB — CA 125: Cancer Ag 125:ACnc:Pt:Ser/Plas:Qn:: 857 — ABNORMAL HIGH

## 2017-10-05 LAB — MONOCYTES RELATIVE PERCENT: Lab: 0

## 2017-10-05 MED ORDER — CYCLOPHOSPHAMIDE 50 MG CAPSULE
ORAL_CAPSULE | Freq: Every day | ORAL | 5 refills | 0 days | Status: SS
Start: 2017-10-05 — End: 2018-02-08

## 2017-10-07 NOTE — Unmapped (Signed)
Specialty Medication Follow-up    Anita Jones is a 56 y.o. female with recurrent fallopian tube cancer who I am seeing for follow up on their treatment with cyclophosphamide + bevacizumab.     Chemotherapy: Cyclophosphamide + Bevacizumab  Start date: 09/14/17    A/P:   1. Oral Chemotherapy: CBC w/diff and CMP reviewed. No grade 3 toxicity noted therefore will continue with current therapy. Will repeat labs in 3 weeks at next chemotherapy appointment.  ?? Continue cyclophosphamide 50 mg OP daily    I spent approximately 15 minutes in direct patient care.    Next follow up: 3 weeks on 10/26/17    Hermelinda Medicus, PharmD, BCOP, CPP  Gynecologic Oncology Clinic Pharmacist  Pager: [phone]    S/O: [name] reports feeling well with no new complaints. She denies any blood in her urine. She denies nausea or vomiting and states that she feels much better than the previous therapy she was on.     Medications reviewed and updated in EPIC? no    Missed doses: None    Labs (10/04/17)  WBC 4.1 x10*9/L   Hgb 9.1 g/dL   Plt 528 U13*2/G   ANC 2.9 x10*9/L   SCr 0.53 mg/dL   TBili 0.2 mg/dL   AST 25 U/L   ALT 19 U/L   Alk Phos 75 U/L

## 2017-10-19 ENCOUNTER — Ambulatory Visit: Payer: BLUE CROSS/BLUE SHIELD

## 2017-10-25 LAB — CBC W/ DIFFERENTIAL
CREATININE: 0.65
HEMATOCRIT: 32.9 %
HEMOGLOBIN: 10.5 g/dL
NEUTROPHILS ABSOLUTE COUNT: 3.7 10*9/L
PLATELET COUNT: 344 10*9/L
POTASSIUM: 4.6 mmol/L
WHITE BLOOD CELL COUNT: 4.8 10*9/L

## 2017-10-25 LAB — HYPERCHROMASIA: Lab: 0

## 2017-10-26 ENCOUNTER — Ambulatory Visit
Admission: RE | Admit: 2017-10-26 | Discharge: 2017-10-26 | Disposition: A | Discharge status: Home / Self Care | Payer: BC Managed Care – PPO

## 2017-10-26 ENCOUNTER — Ambulatory Visit: Admission: RE | Admit: 2017-10-26 | Discharge: 2017-10-26 | Disposition: A | Discharge status: Home / Self Care

## 2017-10-26 DIAGNOSIS — Z9889 Other specified postprocedural states: Principal | ICD-10-CM

## 2017-10-26 DIAGNOSIS — C5700 Malignant neoplasm of unspecified fallopian tube: Secondary | ICD-10-CM

## 2017-10-26 DIAGNOSIS — E039 Hypothyroidism, unspecified: Secondary | ICD-10-CM

## 2017-10-26 LAB — THYROID STIMULATING HORMONE: Thyrotropin:ACnc:Pt:Ser/Plas:Qn:: 3.995 — ABNORMAL HIGH

## 2017-10-26 LAB — FREE T4: Thyroxine.free:MCnc:Pt:Ser/Plas:Qn:: 1.95 — ABNORMAL HIGH

## 2017-10-26 LAB — CA 125
CA 125: 1210 U/mL — ABNORMAL HIGH (ref 0.0–34.9)
Cancer Ag 125:ACnc:Pt:Ser/Plas:Qn:: 1210 — ABNORMAL HIGH

## 2017-10-26 NOTE — Unmapped (Signed)
Pt received Cycle # 3 Avastin without difficulty. Pt C/o Fullness in abdomen. Pt seen by fellow for possible abdominal Tap. RTC in 3 weeks for Chemo

## 2017-10-26 NOTE — Unmapped (Signed)
GYNECOLOGIC ONCOLOGY RETURN VISIT    October 26, 2017 12:03 PM    ASSESSMENT AND PLAN:  Anita Jones is a 56 y.o. woman with Stage IIIC fallopian tube cancer who presents for paracentesis.      REASON FOR VISIT: abdominal distention    TREATMENT HISTORY:  Oncology History    Stage IIIC serous fallopian tube carcinoma    Chemo/rad  history  11/2013 - 04/2014 IP  docetaxel/carbo x 6  01/2015 - 04/2015 Gem/avastin x 3  05/2015 - 09/2015 doxil/carboplatin x 6  11/2015 -pelvic radiation  02/21/16-06/02/16 docetaxel/carbo x 6  06/25/16-zejula parp inhibitor  03/23/17- weekly paclitaxel x 2  05/04/17 -08/2017 docetaxel/carbo x 5, then taxol/carbon x1              Fallopian tube cancer, carcinoma (RAF-HCC)     11/03/2013  Initial Diagnosis  Transfer for [facility] as inpatient with abdominal distention, CT with omental caking and carcinomatosis. CA 125 2999     11/04/2013  Surgery  XL, TAH, BSO, omentectomy, appy, peritoneal stripping, bilatearl ureterolysis, cholecystectomy by surg onc     11/30/2013 - 04/20/2014  Chemotherapy  Docetaxel/carboplatin x 6. Neutropenia with ANC 0.3 after cycle 1. Recieved neulasta after subsequent cycles.     04/23/2014  Interval Scan(s)  CT abdomen/Pelvis: Small volume hypoattenuating material along the glallbladder fossa. Residual disease vs. complex fluid from chemotherapy. F/u exam recommended.     04/30/2014  Procedure  IP port removal     08/21/2014  No evidence of disease  normal exam. ca 125, 17.5     01/03/2015  Progression  PET: External iliac nodes, left greater than right, are larger and more FDG avid compared to prior, concerning for malignancy. - Questionable small nodes in the cardiophrenic angles are indeterminate.     01/18/2015  Surgery  Robotic-assisted bilateral external iliac lymph node biopsy with resection of right pelvic brim peritoneal lesion and extensive enterolysis with primary small bowel repair. 1/3 positive lymph nodes      Chemotherapy  Gemzar/Avastin          3/16: Diagnosis:  A: Right pelvic LN:   - 1 out of 3 lymph nodes shows metastatic adenocarcinoma consistent with  metastatic serous carcinoma (1/3)     B: Left pelvic LN:  - One lymph node with metastatic adenocarcinoma consistent with metastatic  serous carcinoma with tumor necrosis and focal extracapsular extension (B2)  (1/1)     C: Right pelvic sidewall, biopsy   - Fibroadipose tissue, fibrovascular tissue, and nerve, with fibrosis, no tumor  seen     6/16: PET/CT:  1. New, intensely FDG avid, enlarged periaortic lymph node (PET 81).   2. New, FDG avid left common iliac node is also seen (PET 60).   3. Multiple FDG avid left external iliac lymph nodes are identified, the most anterior node was previously more prominent.       Completed 6 cycles carboplatin + DOXIL with CR by PET/CT in 12/16        Gynecologic malignancy (CMS-HCC)    11/03/2013 Initial Diagnosis     Gynecologic malignancy           Fallopian tube cancer, carcinoma (CMS-HCC)    11/03/2013 Initial Diagnosis     Transfer for [facility] as inpatient with abdominal distention, CT with omental caking and carcinomatosis. CA 125 2999         11/04/2013 Surgery     XL, TAH, BSO, omentectomy, appy, peritoneal stripping, bilatearl ureterolysis,  cholecystectomy by surg onc         11/30/2013 - 04/20/2014 Chemotherapy     Docetaxel/carboplatin x 6. Neutropenia with ANC 0.3 after cycle 1. Recieved neulasta after subsequent cycles.          04/23/2014 Interval Scan(s)     CT abdomen/Pelvis: Small volume hypoattenuating material along the glallbladder fossa. Residual disease vs. complex fluid from chemotherapy. F/u exam recommended.         04/30/2014 Procedure     IP port removal         08/21/2014 No evidence of disease     normal exam. ca 125, 17.5         01/03/2015 Progression     PET: External iliac nodes, left greater than right, are larger and more FDG avid compared to prior, concerning for malignancy. - Questionable small nodes in the cardiophrenic angles are indeterminate.          01/18/2015 Surgery     Robotic-assisted bilateral external iliac lymph node biopsy with resection of right pelvic brim peritoneal lesion and extensive enterolysis with primary small bowel repair. 1/3 positive lymph nodes         01/23/2015 - 04/30/2015 Chemotherapy     Gemzar/Avastin x 3. Neulasta         04/29/2015 Progression     PET: New FDG avid left pelvic and retroperitoneal adenopathy, concerning for progression of disease..New focus of uptake in the left lower quadrant abdominal wall musculature is likely postprocedural.          05/31/2015 - 09/20/2015 Chemotherapy     Doxil/Carboplatin x 6  Neulasta         10/15/2015 Interval Scan(s)     PET: Resolution of FDG avid pelvic adenopathy, consistent with response to treatment. No new sites of recurrent or metastatic disease.          11/2015 - 12/2015 Radiation      cosolidation adiotherapy to the pelvis          02/17/2016 Progression     PET: Interval development of hepatic metastatic foci, multiple metastatic soft tissue implants throughout the peritoneum. These regions include perihepatic, perisplenic, scattered mesenteric, left lower abdominal musculature, and retroperitoneal/portacava         02/21/2016 - 06/02/2016 Chemotherapy     Taxotere/Carbo x 6         06/25/2016 -  Chemotherapy     Zejula (parp) 300mg  daily, added Keytruda 01/01/2017         03/09/2017 Progression     PET: Metastatic disease in a left supraclavicular lymph node, retroperitoneal lymph nodes and abdominal soft tissue implants suspected. New large volume ascites.  - Soft tissue with increased uptake at the cystectomy site, worrisome for local recurrence.         03/23/2017 -  Chemotherapy     Chemotherapy: weekly paclitaxel. Plan for 6 cycles         05/04/2017 -  Chemotherapy     Docetaxel/carboplatin plan 6 cycles            INTERVAL HISTORY:  Overall feeling ok. Started noticing her abdomen getting more distended. Eating and drinking but less appetite since the fluid has been reaccumulating. Has plans for the holiday to be at the beach with her family.    PAST MEDICAL/SURGICAL HX:  Past Medical History:   Diagnosis Date   ??? Asthma     exercise induced   ??? BRCA negative    ??? Chemotherapy  follow-up examination     IP Chemo   ??? PONV (postoperative nausea and vomiting)        Past Surgical History:   Procedure Laterality Date   ??? CESAREAN SECTION      x3   ??? HYSTERECTOMY     ??? PR LAP, SURG ENTEROLYSIS N/A 01/18/2015    Procedure: ROBOTIC LAPAROSCOPY, SURGICAL, ENTEROLYSIS (FREEING OF INTESTINAL ADHESION) (SEPARATE PROCEDURE);  Surgeon: Laurence Aly, MD;  Location: MAIN OR [facility];  Service: Gynecology Oncology   ??? PR OOPH W/RADIC DISSECT FOR DEBULKING Midline 11/04/2013    Procedure: RESECTION (INITIAL) OVARIAN, TUBAL/PRIM PERITONEAL MALIG W/BIL S&O/OMENTECT; W/RAD DISSECTION FOR DEBULKING;  Surgeon: Laurence Aly, MD;  Location: MAIN OR [facility];  Service: Gynecology Oncology   ??? PR REMOVAL GALLBLADDER  11/04/2013    Procedure: CHOLECYSTECTOMY;  Surgeon: Laurence Aly, MD;  Location: MAIN OR [facility];  Service: Gynecology Oncology   ??? PR REMOVAL TUNNELED INTRAPERITONEAL CATHETER N/A 04/30/2014    Procedure: REMOVAL OF PERMANENT INTRAPERITONEAL CANNULA OR CATHETER;  Surgeon: Laurence Aly, MD;  Location: MAIN OR [facility];  Service: Gynecology Oncology   ??? PR REMOVAL, ABDOMEN LYMPH NODE,STAGING Bilateral 01/18/2015    Procedure: ROBOTIC XI LIMITED LYMPHADENECTOMY FOR STAGING (SEPARTE PROCEDURE); RETROPERITONEAL (AORTIC AND/OR SPLENIC);  Surgeon: Laurence Aly, MD;  Location: MAIN OR [facility];  Service: Gynecology Oncology       Family History   Problem Relation Age of Onset   ??? Breast cancer Maternal Aunt 68        currently 12   ??? Cancer Maternal Aunt         breast   ??? Anesthesia problems Neg Hx    ??? Thyroid disease Neg Hx    ??? Hypertension Neg Hx    ??? Diabetes Neg Hx        Social History     Social History   ??? Marital status: Married     Spouse name: N/A   ??? Number of children: N/A   ??? Years of education: N/A     Social History Main Topics   ??? Smoking status: Never Smoker   ??? Smokeless tobacco: Never Used   ??? Alcohol use No      Comment: occasional   ??? Drug use: No   ??? Sexual activity: Not on file     Other Topics Concern   ??? Not on file     Social History Narrative   ??? No narrative on file       CURRENT MEDICATIONS:    Current Outpatient Prescriptions:   ???  acyclovir (ZOVIRAX) 400 MG tablet, Take two tablets bid for 3 days when outbreak occurs, Disp: , Rfl:   ???  ALPHA LIPOIC ACID ORAL, Take by mouth daily. Unknown dose, Disp: , Rfl:   ???  calcium carbonate-vitamin D3 600mg  (1,000mg ) -1,000 unit Tab, Take by mouth., Disp: , Rfl:   ???  cyclophosphamide (CYTOXAN) 50 mg capsule, Take 1 capsule (50 mg total) by mouth daily., Disp: 30 capsule, Rfl: 5  ???  dexamethasone (DECADRON) 4 MG tablet, Dexamethasone 8mg  BID on the day before, day of, and day after treatment. (Patient not taking: Reported on 07/29/2017), Disp: 12 tablet, Rfl: 6  ???  docusate sodium (COLACE) 100 MG capsule, Take 1 capsule (100 mg total) by mouth Two (2) times a day., Disp: 60 capsule, Rfl: 0  ???  famotidine (PEPCID) 40 MG tablet, Take 1 tablet (40 mg total) by mouth every evening., Disp: 30  tablet, Rfl: 3  ???  FLUARIX vaccine, quad syringe (36 MOS UP)(PF) 2017-18, TO BE ADMINISTERED BY PHARMACIST FOR IMMUNIZATION, Disp: , Rfl: 0  ???  furosemide (LASIX) 20 MG tablet, Take 1 tablet (20 mg total) by mouth daily., Disp: 30 tablet, Rfl: 4  ???  GLUCOSAM SUL NA/CHONDR SU A NA (GLUCOSAMINE & CHONDROIT SUL.NA ORAL), Take by mouth., Disp: , Rfl:   ???  levothyroxine (SYNTHROID, LEVOTHROID) 125 MCG tablet, Take 1 tablet (125 mcg total) by mouth daily at 0600., Disp: 90 tablet, Rfl: 3  ???  multivitamin (THERAGRAN) per tablet, Take by mouth., Disp: , Rfl:   ???  ondansetron (ZOFRAN) 8 MG tablet, Take 1 tablet (8 mg total) by mouth every eight (8) hours as needed for nausea. (Patient not taking: Reported on 09/16/2017), Disp: 30 tablet, Rfl: 2  ???  prochlorperazine (COMPAZINE) 10 MG tablet, Take 1 tablet (10 mg total) by mouth every six (6) hours as needed for nausea. (Patient not taking: Reported on 09/16/2017), Disp: 30 tablet, Rfl: 2  ???  pyridoxine, vitamin B6, (VITAMIN B-6) 100 MG tablet, Take 100 mg by mouth daily., Disp: , Rfl:   ???  UNABLE TO FIND, Med Name: IP6 - Inositol Hexaphosphate Takes by Mouth daily; unknown dose, Disp: , Rfl:   No current facility-administered medications for this visit.     Facility-Administered Medications Ordered in Other Visits:   ???  heparin, porcine (PF) 100 unit/mL injection 500 Units, 500 Units, Intravenous, Q30 Min PRN, Mohit Joellen Jersey, MD, 500 Units at 10/26/17 1045  ???  OKAY TO SEND MEDICATION/CHEMOTHERAPY TO UNIT, , Other, Once, Maurie Boettcher, MD  ???  OKAY TO SEND MEDICATION/CHEMOTHERAPY TO UNIT, , Other, Once, Maurie Boettcher, MD  ???  sodium chloride (NS) 0.9 % flush 10 mL, 10 mL, Intravenous, Q30 Min PRN, Mohit Joellen Jersey, MD  ???  sodium chloride (NS) 0.9 % infusion, 100 mL/hr, Intravenous, Continuous, Maurie Boettcher, MD, Stopped at 03/13/16 1200  ???  sodium chloride (NS) 0.9 % infusion, 100 mL/hr, Intravenous, Continuous, Maurie Boettcher, MD, Stopped at 10/26/17 1035    REVIEW OF SYSTEMS:  Complete 10-system review is negative except as above in Interval History.    PHYSICAL EXAM:  General: Alert, oriented, no acute distress.  Abdomen: Soft, distended, +fluid wave. Well-healed scars.    BSUS with 3-4L of ascites, safe pocket for paracentesis.    PARACENTESIS PROCEDURE    Prior to the procedure, all risks, benefits, and alternatives were reviewed with the patient and informed surgical consent was obtained.    Bedside ultrasound was performed to identify the largest pocket of fluid in the abdomen.  Site was marked, then cleansed with Betadine.  1% lidocaine without epinephrine was injected at the site.  An 11-blade scalpel was used to make a small incision in the skin.  The paracentesis needle was inserted into the abdomen, with return of straw peritoneal fluid.  The catheter was advanced and the needled was removed.  The catheter was attached to suction.    At the conclusion of the procedure, 3550 ml of fluid was removed.  Patient tolerated the procedure well.

## 2017-10-28 NOTE — Unmapped (Signed)
Specialty Medication Follow-up    Anita Jones is a 56 y.o. female with recurrent fallopian tube cancer who I am seeing for follow up on their treatment with cyclophosphamide + bevacizumab.     Chemotherapy: Cyclophosphamide + Bevacizumab  Start date: 09/14/17    A/P:   1. Oral Chemotherapy: CBC w/diff and CMP reviewed. Grade 1 anemia which has improved. No grade 3 toxicities reported therefore will continue at current dose intensity. Will repeat labs in 3 weeks at next appointment prior to bevacizumab infusion.  ?? Continue cyclophosphamide 50 mg OP daily    I spent approximately 10 minutes in direct patient care.    Next follow up: 3 weeks on 11/19/17    Anita Jones, PharmD, BCOP, CPP  Gynecologic Oncology Clinic Pharmacist  Pager: [phone]    S/O: Anita Jones presents to infusion accompanied by her husband. She reports no nausea or vomiting. She denies any blood in her urine. She does endorse increased bloating and abdominal distention and therefore will have an ultrasound to evaluate ascitic fluid.     Medications reviewed and updated in EPIC? no    Missed doses: None    Labs (10/22/17)  WBC 4.8 x10*9/L   Hgb 10.5 g/dL   Plt 454 U98*1/X   ANC 3.7 x10*9/L   SCr 0.65 mg/dL   TBili < 0.2 mg/dL   AST 20 U/L   ALT 18 U/L   Alk Phos 75 U/L

## 2017-11-18 NOTE — Unmapped (Signed)
Patient called and asked to get an appointment for a paracentesis due to her starting to feel full in the abdominal area.  I contacted Ivonne Andrew, and she verbalized agreement. Patient will get chemo, see Dr. Ruthe Mannan and the Fellow.

## 2017-11-19 ENCOUNTER — Encounter: Admit: 2017-11-19 | Discharge: 2017-11-20 | Payer: PRIVATE HEALTH INSURANCE

## 2017-11-19 ENCOUNTER — Encounter
Admit: 2017-11-19 | Discharge: 2017-11-20 | Payer: PRIVATE HEALTH INSURANCE | Attending: Gynecologic Oncology | Primary: Gynecologic Oncology

## 2017-11-19 DIAGNOSIS — C579 Malignant neoplasm of female genital organ, unspecified: Secondary | ICD-10-CM

## 2017-11-19 DIAGNOSIS — C5700 Malignant neoplasm of unspecified fallopian tube: Principal | ICD-10-CM

## 2017-11-19 LAB — CA 125: Cancer Ag 125:ACnc:Pt:Ser/Plas:Qn:: 2350 — ABNORMAL HIGH

## 2017-11-19 NOTE — Unmapped (Deleted)
Anita Jones [facility] Paracentesis   JB    GYNECOLOGIC ONCOLOGY RETURN VISIT    ***    ASSESSMENT AND PLAN:  Anita Jones is a 57 y.o. woman with Stage IIIC fallopian tube cancer who presents for paracentesis.    REASON FOR VISIT: abdominal distention    TREATMENT HISTORY:  Oncology History    Stage IIIC serous fallopian tube carcinoma    Chemo/rad  history  11/2013 - 04/2014 IP  docetaxel/carbo x 6  01/2015 - 04/2015 Gem/avastin x 3  05/2015 - 09/2015 doxil/carboplatin x 6  11/2015 -pelvic radiation  02/21/16-06/02/16 docetaxel/carbo x 6  06/25/16-zejula parp inhibitor  03/23/17- weekly paclitaxel x 2  05/04/17 -08/2017 docetaxel/carbo x 5, then taxol/carbon x1              Fallopian tube cancer, carcinoma (RAF-HCC)     11/03/2013  Initial Diagnosis  Transfer for [facility] as inpatient with abdominal distention, CT with omental caking and carcinomatosis. CA 125 2999     11/04/2013  Surgery  XL, TAH, BSO, omentectomy, appy, peritoneal stripping, bilatearl ureterolysis, cholecystectomy by surg onc     11/30/2013 - 04/20/2014  Chemotherapy  Docetaxel/carboplatin x 6. Neutropenia with ANC 0.3 after cycle 1. Recieved neulasta after subsequent cycles.     04/23/2014  Interval Scan(s)  CT abdomen/Pelvis: Small volume hypoattenuating material along the glallbladder fossa. Residual disease vs. complex fluid from chemotherapy. F/u exam recommended.     04/30/2014  Procedure  IP port removal     08/21/2014  No evidence of disease  normal exam. ca 125, 17.5     01/03/2015  Progression  PET: External iliac nodes, left greater than right, are larger and more FDG avid compared to prior, concerning for malignancy. - Questionable small nodes in the cardiophrenic angles are indeterminate.     01/18/2015  Surgery  Robotic-assisted bilateral external iliac lymph node biopsy with resection of right pelvic brim peritoneal lesion and extensive enterolysis with primary small bowel repair. 1/3 positive lymph nodes      Chemotherapy  Gemzar/Avastin 3/16:  Diagnosis:  A: Right pelvic LN:   - 1 out of 3 lymph nodes shows metastatic adenocarcinoma consistent with  metastatic serous carcinoma (1/3)     B: Left pelvic LN:  - One lymph node with metastatic adenocarcinoma consistent with metastatic  serous carcinoma with tumor necrosis and focal extracapsular extension (B2)  (1/1)     C: Right pelvic sidewall, biopsy   - Fibroadipose tissue, fibrovascular tissue, and nerve, with fibrosis, no tumor  seen     6/16: PET/CT:  1. New, intensely FDG avid, enlarged periaortic lymph node (PET 81).   2. New, FDG avid left common iliac node is also seen (PET 60).   3. Multiple FDG avid left external iliac lymph nodes are identified, the most anterior node was previously more prominent.       Completed 6 cycles carboplatin + DOXIL with CR by PET/CT in 12/16        Gynecologic malignancy (CMS-HCC)    11/03/2013 Initial Diagnosis     Gynecologic malignancy           Fallopian tube cancer, carcinoma (CMS-HCC)    11/03/2013 Initial Diagnosis     Transfer for [facility] as inpatient with abdominal distention, CT with omental caking and carcinomatosis. CA 125 2999         11/04/2013 Surgery     XL, TAH, BSO, omentectomy, appy, peritoneal stripping, bilatearl ureterolysis, cholecystectomy by surg  onc         11/30/2013 - 04/20/2014 Chemotherapy     Docetaxel/carboplatin x 6. Neutropenia with ANC 0.3 after cycle 1. Recieved neulasta after subsequent cycles.          12/06/2013 Genetics     [employer] One KRAS amplification: Trametinib         04/23/2014 Interval Scan(s)     CT abdomen/Pelvis: Small volume hypoattenuating material along the glallbladder fossa. Residual disease vs. complex fluid from chemotherapy. F/u exam recommended.         04/30/2014 Procedure     IP port removal         08/21/2014 No evidence of disease     normal exam. ca 125, 17.5         01/03/2015 Progression     PET: External iliac nodes, left greater than right, are larger and more FDG avid compared to prior, concerning for malignancy. - Questionable small nodes in the cardiophrenic angles are indeterminate.          01/18/2015 Surgery     Robotic-assisted bilateral external iliac lymph node biopsy with resection of right pelvic brim peritoneal lesion and extensive enterolysis with primary small bowel repair. 1/3 positive lymph nodes         01/23/2015 - 04/30/2015 Chemotherapy     Gemzar/Avastin x 3. Neulasta         04/29/2015 Progression     PET: New FDG avid left pelvic and retroperitoneal adenopathy, concerning for progression of disease..New focus of uptake in the left lower quadrant abdominal wall musculature is likely postprocedural.          05/31/2015 - 09/20/2015 Chemotherapy     Doxil/Carboplatin x 6  Neulasta         10/15/2015 Interval Scan(s)     PET: Resolution of FDG avid pelvic adenopathy, consistent with response to treatment. No new sites of recurrent or metastatic disease.          11/2015 - 12/2015 Radiation      cosolidation adiotherapy to the pelvis          02/17/2016 Progression     PET: Interval development of hepatic metastatic foci, multiple metastatic soft tissue implants throughout the peritoneum. These regions include perihepatic, perisplenic, scattered mesenteric, left lower abdominal musculature, and retroperitoneal/portacava         02/21/2016 - 06/02/2016 Chemotherapy     Taxotere/Carbo x 6         06/25/2016 -  Chemotherapy     Zejula (parp) 300mg  daily, added Keytruda 01/01/2017         03/09/2017 Progression     PET: Metastatic disease in a left supraclavicular lymph node, retroperitoneal lymph nodes and abdominal soft tissue implants suspected. New large volume ascites.  - Soft tissue with increased uptake at the cystectomy site, worrisome for local recurrence.         03/23/2017 -  Chemotherapy     Chemotherapy: weekly paclitaxel. Plan for 6 cycles         05/04/2017 -  Chemotherapy     Docetaxel/carboplatin plan 6 cycles         09/16/2017 -  Chemotherapy     Chemotherapy Treatment    Treatment Goal Curative   Line of Treatment [No plan line of treatment]   Plan Name OP OVARIAN DOCETAXEL/CARBOPLATIN   Start Date 11/29/2013   End Date 04/06/2014 (Planned)   Provider Joesphine Bare, NP   Chemotherapy dexamethasone (DECADRON) tablet 12 mg,  12 mg, Oral, Once, 3 of 7 cycles    CARBOplatin (PARAPLATIN) 729.5 mg IVPB, 729.5 mg (100 % of original dose 729.5 mg), Intravenous, Once, 3 of 7 cycles  Dose modification: 729.5 mg (original dose 729.5 mg, Cycle 1)    DOCEtaxel (TAXOTERE) 130.5 mg in sodium chloride 0.9% NON-PVC (NS) 250 mL IVPB, 75 mg/m2 = 130.5 mg, Intravenous, Once, 3 of 7 cycles       Chemotherapy Treatment    Treatment Goal Curative   Line of Treatment [No plan line of treatment]   Plan Name Fallopian Tube OP Taxil/Cisplatin Intraperitoneal   Start Date 02/27/2014 (Planned)   End Date 03/06/2014 (Planned)   Provider Joesphine Bare, NP   Chemotherapy [No matching medication found in this treatment plan]     Chemotherapy Treatment    Treatment Goal Curative   Line of Treatment [No plan line of treatment]   Plan Name OP Fallopian Tube PACLITAXEL/Cisplatin intaperineal and intravenous   Start Date 02/27/2014   End Date 03/27/2014   Provider Joesphine Bare, NP   Chemotherapy dexamethasone (DECADRON) tablet 12 mg, 12 mg, Oral, Once, 2 of 2 cycles    CISplatin (PLATINOL) 126 mg in sodium chloride (NS) 0.9 % 1,000 mL IVPB, 75 mg/m2 = 126 mg (100 % of original dose 75 mg/m2), Intraperitoneal, Once, 2 of 2 cycles  Dose modification: 75 mg/m2 (original dose 75 mg/m2, Cycle 1), 75 mg/m2 (original dose 75 mg/m2, Cycle 1)    PACLitaxel (TAXOL) 227 mg in sodium chloride 0.9% NON-PVC (NS) 250 mL IVPB, 135 mg/m2 = 227 mg (100 % of original dose 135 mg/m2), Intravenous, Once, 2 of 2 cycles  Dose modification: 135 mg/m2 (original dose 60 mg/m2, Cycle 1, Reason: Other (See Comments), Comment: per intraperitoneal protocol.), 60 mg/m2 (original dose 60 mg/m2, Cycle 1, Reason: Other (See Comments)), 60 mg/m2 (original dose 135 mg/m2, Cycle 2, Reason: Other (See Comments))       Chemotherapy Treatment    Treatment Goal Curative   Line of Treatment [No plan line of treatment]   Plan Name OP OVARIAN GEMCITABINE (21 DAYS)   Start Date 02/19/2015   End Date 06/11/2015 (Planned)   Provider Laurence Aly, MD   Chemotherapy dexamethasone (DECADRON) tablet 8 mg, 8 mg, Oral, Once, 3 of 6 cycles    bevacizumab (AVASTIN) 1,095 mg in sodium chloride (NS) 0.9 % 100 mL IVPB, 15 mg/kg = 1,095 mg (100 % of original dose 15 mg/kg), Intravenous, Once, 3 of 6 cycles  Dose modification: 15 mg/kg (original dose 15 mg/kg, Cycle 1)    gemcitabine (GEMZAR) 1,790.11 mg in sodium chloride (NS) 0.9 % 250 mL IVPB, 1,000 mg/m2 = 1,790.11 mg, Intravenous, Once, 3 of 6 cycles       Chemotherapy Treatment    Treatment Goal Curative   Line of Treatment [No plan line of treatment]   Plan Name OP OVARIAN DOXORUBICIN LIPOSOMAL/CARBOPLATIN   Start Date 05/03/2015   End Date 09/24/2015   Provider Laurence Aly, MD   Chemotherapy dexamethasone (DECADRON) tablet 12 mg, 12 mg, Oral, Once, 6 of 6 cycles    CARBOplatin (PARAPLATIN) 750 mg IVPB, 750 mg (100 % of original dose 750 mg), Intravenous, Once, 6 of 6 cycles  Dose modification:   (original dose 750 mg, Cycle 1),   (original dose 674 mg, Cycle 2),   (original dose 674 mg, Cycle 3), 690 mg (original dose 674 mg, Cycle 6, Reason: Other (See Comments), Comment: based on previous creatinine on  10/16 of .65)    DOXOrubicin liposome (DOXIL) 52 mg in dextrose 5 % 250 mL IVPB, 30 mg/m2 = 52 mg, Intravenous, Once, 6 of 6 cycles       Chemotherapy Treatment    Treatment Goal Control   Line of Treatment [No plan line of treatment]   Plan Name OP OVARIAN DOCETAXEL/CARBOPLATIN   Start Date 02/21/2016   End Date 06/02/2016   Provider Maurie Boettcher, MD   Chemotherapy dexamethasone (DECADRON) tablet 12 mg, 12 mg, Oral, Once, 6 of 6 cycles    CARBOplatin (PARAPLATIN) 750 mg IVPB, 750 mg (112.4 % of original dose 667 mg), Intravenous, Once, 6 of 6 cycles  Dose modification:   (original dose 667 mg, Cycle 1), 750 mg (original dose 667 mg, Cycle 1)    DOCEtaxel (TAXOTERE) 139.5 mg in sodium chloride 0.9% NON-PVC (NS) 250 mL IVPB, 75 mg/m2 = 139.5 mg, Intravenous, Once, 6 of 6 cycles       Chemotherapy Treatment    Treatment Goal Control   Line of Treatment [No plan line of treatment]   Plan Name OP OVARIAN PEMBROLIZUMAB   Start Date 01/01/2017   End Date 06/01/2017 (Planned)   Provider Maurie Boettcher, MD   Chemotherapy pembrolizumab [facility] 200 mg in sodium chloride (NS) 0.9 % 50 mL IVPB, 200 mg, Intravenous, Once, 3 of 8 cycles       Chemotherapy Treatment    Treatment Goal Control   Line of Treatment [No plan line of treatment]   Plan Name OP OVARIAN PACLITAXEL (WEEKLY)   Start Date 03/23/2017   End Date 07/27/2017 (Planned)   Provider Maurie Boettcher, MD   Chemotherapy dexamethasone (DECADRON) tablet 20 mg, 20 mg, Oral, Once, 2 of 6 cycles    PACLItaxel (TAXOL) 140.82 mg in sodium chloride 0.9% NON-PVC (NS) 250 mL IVPB, 80 mg/m2 = 140.82 mg, Intravenous, Once, 2 of 6 cycles       Chemotherapy Treatment    Treatment Goal Control   Line of Treatment [No plan line of treatment]   Plan Name OP OVARIAN PACLITAXEL/CARBOPLATIN   Start Date 04/28/2017 (Planned)   End Date 08/11/2017 (Planned)   Provider Maurie Boettcher, MD   Chemotherapy dexamethasone (DECADRON) tablet 12 mg, 12 mg, Oral, Once, 0 of 6 cycles    CARBOplatin (PARAPLATIN) 812.4 mg in sodium chloride (NS) 0.9% 100 mL IVPB, 812.4 mg (100 % of original dose 812.4 mg), Intravenous, Once, 0 of 6 cycles  Dose modification:   (original dose 812.4 mg, Cycle 1), 812.4 mg (original dose 812.4 mg, Cycle 1)    PACLItaxel (TAXOL) 311.52 mg in sodium chloride 0.9% NON-PVC (NS) 500 mL IVPB, 175 mg/m2 = 311.52 mg, Intravenous, Once, 0 of 6 cycles       Chemotherapy Treatment    Treatment Goal Control   Line of Treatment [No plan line of treatment]   Plan Name OP OVARIAN DOCETAXEL/CARBOPLATIN   Start Date 05/04/2017   End Date 07/08/2017   Provider Maurie Boettcher, MD   Chemotherapy dexamethasone (DECADRON) tablet 12 mg, 12 mg, Oral, Once, 4 of 4 cycles    CARBOplatin (PARAPLATIN) 629.5 mg IVPB, 629.5 mg (100 % of original dose 629.5 mg), Intravenous, Once, 4 of 4 cycles  Dose modification:   (original dose 629.5 mg, Cycle 1)    DOCEtaxel (TAXOTERE) 133.5 mg in sodium chloride 0.9% NON-PVC (NS) 250 mL IVPB, 75 mg/m2 = 133.5 mg, Intravenous, Once, 4 of 4 cycles  Chemotherapy Treatment    Treatment Goal Control   Line of Treatment [No plan line of treatment]   Plan Name OP OVARIAN PACLITAXEL/CARBOPLATIN   Start Date 07/28/2017   End Date 08/20/2017   Provider Maurie Boettcher, MD   Chemotherapy dexamethasone (DECADRON) tablet 12 mg, 12 mg, Oral, Once, 2 of 2 cycles    CARBOplatin (PARAPLATIN) 900 mg IVPB, 900 mg (104.1 % of original dose 864.6 mg), Intravenous, Once, 2 of 2 cycles  Dose modification:   (original dose 864.6 mg, Cycle 1)    PACLItaxel (TAXOL) 315 mg in sodium chloride 0.9% NON-PVC (NS) 500 mL IVPB, 175 mg/m2 = 315 mg, Intravenous, Once, 2 of 2 cycles       Chemotherapy Treatment    Treatment Goal Control   Line of Treatment [No plan line of treatment]   Plan Name OP OVARIAN BEVACIZUMAB   Start Date 09/16/2017   End Date 12/28/2017 (Planned)   Provider Maurie Boettcher, MD   Chemotherapy bevacizumab (AVASTIN) 1,056 mg in sodium chloride (NS) 0.9% 100 mL IVPB, 15 mg/kg = 1,056 mg, Intravenous, Once, 3 of 6 cycles                 INTERVAL HISTORY:  Overall feeling ok. Started noticing her abdomen getting more distended. Eating and drinking but less appetite since the fluid has been reaccumulating. Has plans for the holiday to be at the beach with her family.    PAST MEDICAL/SURGICAL HX:  Past Medical History:   Diagnosis Date   ??? Asthma     exercise induced   ??? BRCA negative    ??? Chemotherapy follow-up examination     IP Chemo   ??? PONV (postoperative nausea and vomiting)        Past Surgical History:   Procedure Laterality Date   ??? CESAREAN SECTION      x3   ??? HYSTERECTOMY     ??? PR LAP, SURG ENTEROLYSIS N/A 01/18/2015    Procedure: ROBOTIC LAPAROSCOPY, SURGICAL, ENTEROLYSIS (FREEING OF INTESTINAL ADHESION) (SEPARATE PROCEDURE);  Surgeon: Laurence Aly, MD;  Location: MAIN OR [facility];  Service: Gynecology Oncology   ??? PR OOPH W/RADIC DISSECT FOR DEBULKING Midline 11/04/2013    Procedure: RESECTION (INITIAL) OVARIAN, TUBAL/PRIM PERITONEAL MALIG W/BIL S&O/OMENTECT; W/RAD DISSECTION FOR DEBULKING;  Surgeon: Laurence Aly, MD;  Location: MAIN OR [facility];  Service: Gynecology Oncology   ??? PR REMOVAL GALLBLADDER  11/04/2013    Procedure: CHOLECYSTECTOMY;  Surgeon: Laurence Aly, MD;  Location: MAIN OR [facility];  Service: Gynecology Oncology   ??? PR REMOVAL TUNNELED INTRAPERITONEAL CATHETER N/A 04/30/2014    Procedure: REMOVAL OF PERMANENT INTRAPERITONEAL CANNULA OR CATHETER;  Surgeon: Laurence Aly, MD;  Location: MAIN OR [facility];  Service: Gynecology Oncology   ??? PR REMOVAL, ABDOMEN LYMPH NODE,STAGING Bilateral 01/18/2015    Procedure: ROBOTIC XI LIMITED LYMPHADENECTOMY FOR STAGING (SEPARTE PROCEDURE); RETROPERITONEAL (AORTIC AND/OR SPLENIC);  Surgeon: Laurence Aly, MD;  Location: MAIN OR [facility];  Service: Gynecology Oncology       Family History   Problem Relation Age of Onset   ??? Breast cancer Maternal Aunt 68        currently 66   ??? Cancer Maternal Aunt         breast   ??? Anesthesia problems Neg Hx    ??? Thyroid disease Neg Hx    ??? Hypertension Neg Hx    ??? Diabetes Neg Hx        Social History  Social History   ??? Marital status: Married     Spouse name: N/A   ??? Number of children: N/A   ??? Years of education: N/A     Social History Main Topics   ??? Smoking status: Never Smoker   ??? Smokeless tobacco: Never Used   ??? Alcohol use No      Comment: occasional   ??? Drug use: No   ??? Sexual activity: Not on file     Other Topics Concern   ??? Not on file     Social History Narrative   ??? No narrative on file       CURRENT MEDICATIONS:    Current Outpatient Prescriptions:   ???  acyclovir (ZOVIRAX) 400 MG tablet, Take two tablets bid for 3 days when outbreak occurs, Disp: , Rfl:   ???  ALPHA LIPOIC ACID ORAL, Take by mouth daily. Unknown dose, Disp: , Rfl:   ???  calcium carbonate-vitamin D3 600mg  (1,000mg ) -1,000 unit Tab, Take by mouth., Disp: , Rfl:   ???  cyclophosphamide (CYTOXAN) 50 mg capsule, Take 1 capsule (50 mg total) by mouth daily., Disp: 30 capsule, Rfl: 5  ???  dexamethasone (DECADRON) 4 MG tablet, Dexamethasone 8mg  BID on the day before, day of, and day after treatment. (Patient not taking: Reported on 07/29/2017), Disp: 12 tablet, Rfl: 6  ???  docusate sodium (COLACE) 100 MG capsule, Take 1 capsule (100 mg total) by mouth Two (2) times a day., Disp: 60 capsule, Rfl: 0  ???  famotidine (PEPCID) 40 MG tablet, Take 1 tablet (40 mg total) by mouth every evening., Disp: 30 tablet, Rfl: 3  ???  FLUARIX vaccine, quad syringe (36 MOS UP)(PF) 2017-18, TO BE ADMINISTERED BY PHARMACIST FOR IMMUNIZATION, Disp: , Rfl: 0  ???  furosemide (LASIX) 20 MG tablet, Take 1 tablet (20 mg total) by mouth daily., Disp: 30 tablet, Rfl: 4  ???  GLUCOSAM SUL NA/CHONDR SU A NA (GLUCOSAMINE & CHONDROIT SUL.NA ORAL), Take by mouth., Disp: , Rfl:   ???  levothyroxine (SYNTHROID, LEVOTHROID) 125 MCG tablet, Take 1 tablet (125 mcg total) by mouth daily at 0600., Disp: 90 tablet, Rfl: 3  ???  multivitamin (THERAGRAN) per tablet, Take by mouth., Disp: , Rfl:   ???  ondansetron (ZOFRAN) 8 MG tablet, Take 1 tablet (8 mg total) by mouth every eight (8) hours as needed for nausea. (Patient not taking: Reported on 09/16/2017), Disp: 30 tablet, Rfl: 2  ???  prochlorperazine (COMPAZINE) 10 MG tablet, Take 1 tablet (10 mg total) by mouth every six (6) hours as needed for nausea. (Patient not taking: Reported on 09/16/2017), Disp: 30 tablet, Rfl: 2  ???  pyridoxine, vitamin B6, (VITAMIN B-6) 100 MG tablet, Take 100 mg by mouth daily., Disp: , Rfl:   ???  UNABLE TO FIND, Med Name: IP6 - Inositol Hexaphosphate Takes by Mouth daily; unknown dose, Disp: , Rfl:   No current facility-administered medications for this visit.     Facility-Administered Medications Ordered in Other Visits:   ???  OKAY TO SEND MEDICATION/CHEMOTHERAPY TO UNIT, , Other, Once, Maurie Boettcher, MD  ???  sodium chloride (NS) 0.9 % infusion, 100 mL/hr, Intravenous, Continuous, Maurie Boettcher, MD, Stopped at 03/13/16 1200    REVIEW OF SYSTEMS:  Complete 10-system review is negative except as above in Interval History.    PHYSICAL EXAM:  General: Alert, oriented, no acute distress.  Abdomen: Soft, distended, +fluid wave. Well-healed scars.    BSUS with ***safe pocket for  paracentesis.    PARACENTESIS PROCEDURE    Prior to the procedure, all risks, benefits, and alternatives were reviewed with the patient and informed surgical consent was obtained.    Bedside ultrasound was performed to identify the largest pocket of fluid in the abdomen.  Site was marked, then cleansed with Betadine.  1% lidocaine without epinephrine was injected at the site.  An 11-blade scalpel was used to make a small incision in the skin.  The paracentesis needle was inserted into the abdomen, with return of *** peritoneal fluid.  The catheter was advanced and the needled was removed.  The catheter was attached to suction.    At the conclusion of the procedure, *** ml of fluid was removed.  Patient tolerated the procedure well.

## 2017-11-19 NOTE — Unmapped (Signed)
Followup Visit Note      Patient Name: Anita Jones  Patient Age: 57 y.o.  Encounter Date: 11/19/2017    Assessment/Plan:    Here today in follow-up for history of stage IIIc serous fallopian tube carcinoma.  Her original diagnosis was December 2014.  Her history is well outlined below.  She has been getting consultations at [facility] and recently was evaluated.  Given her lack of response to oral Cytoxan and bevacizumab they have recommended oral etoposide.  Our last imaging study shows that her disease is confined to her abdomen with the exception of a supraclavicular lymph node which is asymptomatic.  Her biggest issue is massive ascites which causes a lot of tension in her abdomen and symptoms.  We have had to periodically tap fluid from her abdomen and she is requesting this again today.  She does not have major organ involvement by cancer and overall her performance status is excellent.    Our plan at this point is to start with oral etoposide.  She is going to get an Aspira drain next week and then has a vacation.  I discussed with her and her husband today that radical concepts would be to try and reduce her production of ascites.  Since we have not been able to reduce the production by reducing her cancer volume other options would be to drain the ascites and consider sclerosing her abdomen with an agent such as bevacizumab.  I explained this is an older procedure to try to reduce abdominal ascites that is symptomatic.  We have also historically used liquid radiation in the form of P 32.  Lastly we talked about Hypaque which is not studied in her setting however could potentially reduce her peritoneal disease and reduce her ascites production.  I will need to discuss with radiation oncology and surgical oncology these options.  All of their questions were answered    Reason for visit  Fallopian tube cancer    Patient is here to discuss ongoing plans for treatment.      I have independently reviewed the patient's Medical Records     Interval History:    Oncology History    Stage IIIC serous fallopian tube carcinoma    Chemo/rad  history  11/2013 - 04/2014 IP  docetaxel/carbo x 6  01/2015 - 04/2015 Gem/avastin x 3  05/2015 - 09/2015 doxil/carboplatin x 6  11/2015 -pelvic radiation  02/21/16-06/02/16 docetaxel/carbo x 6  06/25/16-zejula parp inhibitor  03/23/17- weekly paclitaxel x 2  05/04/17 -08/2017 docetaxel/carbo x 5, then taxol/carbon x1              Fallopian tube cancer, carcinoma (RAF-HCC)     11/03/2013  Initial Diagnosis  Transfer for [facility] as inpatient with abdominal distention, CT with omental caking and carcinomatosis. CA 125 2999     11/04/2013  Surgery  XL, TAH, BSO, omentectomy, appy, peritoneal stripping, bilatearl ureterolysis, cholecystectomy by surg onc     11/30/2013 - 04/20/2014  Chemotherapy  Docetaxel/carboplatin x 6. Neutropenia with ANC 0.3 after cycle 1. Recieved neulasta after subsequent cycles.     04/23/2014  Interval Scan(s)  CT abdomen/Pelvis: Small volume hypoattenuating material along the glallbladder fossa. Residual disease vs. complex fluid from chemotherapy. F/u exam recommended.     04/30/2014  Procedure  IP port removal     08/21/2014  No evidence of disease  normal exam. ca 125, 17.5     01/03/2015  Progression  PET: External iliac nodes,  left greater than right, are larger and more FDG avid compared to prior, concerning for malignancy. - Questionable small nodes in the cardiophrenic angles are indeterminate.     01/18/2015  Surgery  Robotic-assisted bilateral external iliac lymph node biopsy with resection of right pelvic brim peritoneal lesion and extensive enterolysis with primary small bowel repair. 1/3 positive lymph nodes      Chemotherapy  Gemzar/Avastin          3/16:  Diagnosis:  A: Right pelvic LN:   - 1 out of 3 lymph nodes shows metastatic adenocarcinoma consistent with  metastatic serous carcinoma (1/3)     B: Left pelvic LN:  - One lymph node with metastatic adenocarcinoma consistent with metastatic  serous carcinoma with tumor necrosis and focal extracapsular extension (B2)  (1/1)     C: Right pelvic sidewall, biopsy   - Fibroadipose tissue, fibrovascular tissue, and nerve, with fibrosis, no tumor  seen     6/16: PET/CT:  1. New, intensely FDG avid, enlarged periaortic lymph node (PET 81).   2. New, FDG avid left common iliac node is also seen (PET 60).   3. Multiple FDG avid left external iliac lymph nodes are identified, the most anterior node was previously more prominent.       Completed 6 cycles carboplatin + DOXIL with CR by PET/CT in 12/16        Gynecologic malignancy (CMS-HCC)    11/03/2013 Initial Diagnosis     Gynecologic malignancy           Fallopian tube cancer, carcinoma (CMS-HCC)    11/03/2013 Initial Diagnosis     Transfer for [facility] as inpatient with abdominal distention, CT with omental caking and carcinomatosis. CA 125 2999         11/04/2013 Surgery     XL, TAH, BSO, omentectomy, appy, peritoneal stripping, bilatearl ureterolysis, cholecystectomy by surg onc         11/30/2013 - 04/20/2014 Chemotherapy     Docetaxel/carboplatin x 6. Neutropenia with ANC 0.3 after cycle 1. Recieved neulasta after subsequent cycles.          12/06/2013 Genetics     Foundation One KRAS amplification: Trametinib         04/23/2014 Interval Scan(s)     CT abdomen/Pelvis: Small volume hypoattenuating material along the glallbladder fossa. Residual disease vs. complex fluid from chemotherapy. F/u exam recommended.         04/30/2014 Procedure     IP port removal         08/21/2014 No evidence of disease     normal exam. ca 125, 17.5         01/03/2015 Progression     PET: External iliac nodes, left greater than right, are larger and more FDG avid compared to prior, concerning for malignancy. - Questionable small nodes in the cardiophrenic angles are indeterminate.          01/18/2015 Surgery     Robotic-assisted bilateral external iliac lymph node biopsy with resection of right pelvic brim peritoneal lesion and extensive enterolysis with primary small bowel repair. 1/3 positive lymph nodes         01/23/2015 - 04/30/2015 Chemotherapy     Gemzar/Avastin x 3. Neulasta         04/29/2015 Progression     PET: New FDG avid left pelvic and retroperitoneal adenopathy, concerning for progression of disease..New focus of uptake in the left lower quadrant abdominal wall musculature is likely postprocedural.  05/31/2015 - 09/20/2015 Chemotherapy     Doxil/Carboplatin x 6  Neulasta         10/15/2015 Interval Scan(s)     PET: Resolution of FDG avid pelvic adenopathy, consistent with response to treatment. No new sites of recurrent or metastatic disease.          11/2015 - 12/2015 Radiation      cosolidation adiotherapy to the pelvis          02/17/2016 Progression     PET: Interval development of hepatic metastatic foci, multiple metastatic soft tissue implants throughout the peritoneum. These regions include perihepatic, perisplenic, scattered mesenteric, left lower abdominal musculature, and retroperitoneal/portacava         02/21/2016 - 06/02/2016 Chemotherapy     Taxotere/Carbo x 6         06/25/2016 -  Chemotherapy     Zejula (parp) 300mg  daily, added Keytruda 01/01/2017         03/09/2017 Progression     PET: Metastatic disease in a left supraclavicular lymph node, retroperitoneal lymph nodes and abdominal soft tissue implants suspected. New large volume ascites.  - Soft tissue with increased uptake at the cystectomy site, worrisome for local recurrence.         03/23/2017 -  Chemotherapy     Chemotherapy: weekly paclitaxel. Plan for 6 cycles         05/04/2017 -  Chemotherapy     Docetaxel/carboplatin plan 6 cycles         09/16/2017 -  Chemotherapy     Chemotherapy Treatment    Treatment Goal Curative   Line of Treatment [No plan line of treatment]   Plan Name OP OVARIAN DOCETAXEL/CARBOPLATIN   Start Date 11/29/2013   End Date 04/06/2014 (Planned)   Provider Joesphine Bare, NP   Chemotherapy dexamethasone (DECADRON) tablet 12 mg, 12 mg, Oral, Once, 3 of 7 cycles    CARBOplatin (PARAPLATIN) 729.5 mg IVPB, 729.5 mg (100 % of original dose 729.5 mg), Intravenous, Once, 3 of 7 cycles  Dose modification: 729.5 mg (original dose 729.5 mg, Cycle 1)    DOCEtaxel (TAXOTERE) 130.5 mg in sodium chloride 0.9% NON-PVC (NS) 250 mL IVPB, 75 mg/m2 = 130.5 mg, Intravenous, Once, 3 of 7 cycles       Chemotherapy Treatment    Treatment Goal Curative   Line of Treatment [No plan line of treatment]   Plan Name Fallopian Tube OP Taxil/Cisplatin Intraperitoneal   Start Date 02/27/2014 (Planned)   End Date 03/06/2014 (Planned)   Provider Joesphine Bare, NP   Chemotherapy [No matching medication found in this treatment plan]     Chemotherapy Treatment    Treatment Goal Curative   Line of Treatment [No plan line of treatment]   Plan Name OP Fallopian Tube PACLITAXEL/Cisplatin intaperineal and intravenous   Start Date 02/27/2014   End Date 03/27/2014   Provider Joesphine Bare, NP   Chemotherapy dexamethasone (DECADRON) tablet 12 mg, 12 mg, Oral, Once, 2 of 2 cycles    CISplatin (PLATINOL) 126 mg in sodium chloride (NS) 0.9 % 1,000 mL IVPB, 75 mg/m2 = 126 mg (100 % of original dose 75 mg/m2), Intraperitoneal, Once, 2 of 2 cycles  Dose modification: 75 mg/m2 (original dose 75 mg/m2, Cycle 1), 75 mg/m2 (original dose 75 mg/m2, Cycle 1)    PACLitaxel (TAXOL) 227 mg in sodium chloride 0.9% NON-PVC (NS) 250 mL IVPB, 135 mg/m2 = 227 mg (100 % of original dose 135 mg/m2), Intravenous, Once, 2 of  2 cycles  Dose modification: 135 mg/m2 (original dose 60 mg/m2, Cycle 1, Reason: Other (See Comments), Comment: per intraperitoneal protocol.), 60 mg/m2 (original dose 60 mg/m2, Cycle 1, Reason: Other (See Comments)), 60 mg/m2 (original dose 135 mg/m2, Cycle 2, Reason: Other (See Comments))       Chemotherapy Treatment    Treatment Goal Curative   Line of Treatment [No plan line of treatment]   Plan Name OP OVARIAN GEMCITABINE (21 DAYS) Start Date 02/19/2015   End Date 06/11/2015 (Planned)   Provider Laurence Aly, MD   Chemotherapy dexamethasone (DECADRON) tablet 8 mg, 8 mg, Oral, Once, 3 of 6 cycles    bevacizumab (AVASTIN) 1,095 mg in sodium chloride (NS) 0.9 % 100 mL IVPB, 15 mg/kg = 1,095 mg (100 % of original dose 15 mg/kg), Intravenous, Once, 3 of 6 cycles  Dose modification: 15 mg/kg (original dose 15 mg/kg, Cycle 1)    gemcitabine (GEMZAR) 1,790.11 mg in sodium chloride (NS) 0.9 % 250 mL IVPB, 1,000 mg/m2 = 1,790.11 mg, Intravenous, Once, 3 of 6 cycles       Chemotherapy Treatment    Treatment Goal Curative   Line of Treatment [No plan line of treatment]   Plan Name OP OVARIAN DOXORUBICIN LIPOSOMAL/CARBOPLATIN   Start Date 05/03/2015   End Date 09/24/2015   Provider Laurence Aly, MD   Chemotherapy dexamethasone (DECADRON) tablet 12 mg, 12 mg, Oral, Once, 6 of 6 cycles    CARBOplatin (PARAPLATIN) 750 mg IVPB, 750 mg (100 % of original dose 750 mg), Intravenous, Once, 6 of 6 cycles  Dose modification:   (original dose 750 mg, Cycle 1),   (original dose 674 mg, Cycle 2),   (original dose 674 mg, Cycle 3), 690 mg (original dose 674 mg, Cycle 6, Reason: Other (See Comments), Comment: based on previous creatinine on 10/16 of .65)    DOXOrubicin liposome (DOXIL) 52 mg in dextrose 5 % 250 mL IVPB, 30 mg/m2 = 52 mg, Intravenous, Once, 6 of 6 cycles       Chemotherapy Treatment    Treatment Goal Control   Line of Treatment [No plan line of treatment]   Plan Name OP OVARIAN DOCETAXEL/CARBOPLATIN   Start Date 02/21/2016   End Date 06/02/2016   Provider Maurie Boettcher, MD   Chemotherapy dexamethasone (DECADRON) tablet 12 mg, 12 mg, Oral, Once, 6 of 6 cycles    CARBOplatin (PARAPLATIN) 750 mg IVPB, 750 mg (112.4 % of original dose 667 mg), Intravenous, Once, 6 of 6 cycles  Dose modification:   (original dose 667 mg, Cycle 1), 750 mg (original dose 667 mg, Cycle 1)    DOCEtaxel (TAXOTERE) 139.5 mg in sodium chloride 0.9% NON-PVC (NS) 250 mL IVPB, 75 mg/m2 = 139.5 mg, Intravenous, Once, 6 of 6 cycles       Chemotherapy Treatment    Treatment Goal Control   Line of Treatment [No plan line of treatment]   Plan Name OP OVARIAN PEMBROLIZUMAB   Start Date 01/01/2017   End Date 06/01/2017 (Planned)   Provider Maurie Boettcher, MD   Chemotherapy pembrolizumab [facility]) 200 mg in sodium chloride (NS) 0.9 % 50 mL IVPB, 200 mg, Intravenous, Once, 3 of 8 cycles       Chemotherapy Treatment    Treatment Goal Control   Line of Treatment [No plan line of treatment]   Plan Name OP OVARIAN PACLITAXEL (WEEKLY)   Start Date 03/23/2017   End Date 07/27/2017 (Planned)   Provider Maurie Boettcher, MD  Chemotherapy dexamethasone (DECADRON) tablet 20 mg, 20 mg, Oral, Once, 2 of 6 cycles    PACLItaxel (TAXOL) 140.82 mg in sodium chloride 0.9% NON-PVC (NS) 250 mL IVPB, 80 mg/m2 = 140.82 mg, Intravenous, Once, 2 of 6 cycles       Chemotherapy Treatment    Treatment Goal Control   Line of Treatment [No plan line of treatment]   Plan Name OP OVARIAN PACLITAXEL/CARBOPLATIN   Start Date 04/28/2017 (Planned)   End Date 08/11/2017 (Planned)   Provider Maurie Boettcher, MD   Chemotherapy dexamethasone (DECADRON) tablet 12 mg, 12 mg, Oral, Once, 0 of 6 cycles    CARBOplatin (PARAPLATIN) 812.4 mg in sodium chloride (NS) 0.9% 100 mL IVPB, 812.4 mg (100 % of original dose 812.4 mg), Intravenous, Once, 0 of 6 cycles  Dose modification:   (original dose 812.4 mg, Cycle 1), 812.4 mg (original dose 812.4 mg, Cycle 1)    PACLItaxel (TAXOL) 311.52 mg in sodium chloride 0.9% NON-PVC (NS) 500 mL IVPB, 175 mg/m2 = 311.52 mg, Intravenous, Once, 0 of 6 cycles       Chemotherapy Treatment    Treatment Goal Control   Line of Treatment [No plan line of treatment]   Plan Name OP OVARIAN DOCETAXEL/CARBOPLATIN   Start Date 05/04/2017   End Date 07/08/2017   Provider Maurie Boettcher, MD   Chemotherapy dexamethasone (DECADRON) tablet 12 mg, 12 mg, Oral, Once, 4 of 4 cycles    CARBOplatin (PARAPLATIN) 629.5 mg IVPB, 629.5 mg (100 % of original dose 629.5 mg), Intravenous, Once, 4 of 4 cycles  Dose modification:   (original dose 629.5 mg, Cycle 1)    DOCEtaxel (TAXOTERE) 133.5 mg in sodium chloride 0.9% NON-PVC (NS) 250 mL IVPB, 75 mg/m2 = 133.5 mg, Intravenous, Once, 4 of 4 cycles       Chemotherapy Treatment    Treatment Goal Control   Line of Treatment [No plan line of treatment]   Plan Name OP OVARIAN PACLITAXEL/CARBOPLATIN   Start Date 07/28/2017   End Date 08/20/2017   Provider Maurie Boettcher, MD   Chemotherapy dexamethasone (DECADRON) tablet 12 mg, 12 mg, Oral, Once, 2 of 2 cycles    CARBOplatin (PARAPLATIN) 900 mg IVPB, 900 mg (104.1 % of original dose 864.6 mg), Intravenous, Once, 2 of 2 cycles  Dose modification:   (original dose 864.6 mg, Cycle 1)    PACLItaxel (TAXOL) 315 mg in sodium chloride 0.9% NON-PVC (NS) 500 mL IVPB, 175 mg/m2 = 315 mg, Intravenous, Once, 2 of 2 cycles       Chemotherapy Treatment    Treatment Goal Control   Line of Treatment [No plan line of treatment]   Plan Name OP OVARIAN BEVACIZUMAB   Start Date 09/16/2017   End Date 12/28/2017 (Planned)   Provider Maurie Boettcher, MD   Chemotherapy bevacizumab (AVASTIN) 1,056 mg in sodium chloride (NS) 0.9% 100 mL IVPB, 15 mg/kg = 1,056 mg, Intravenous, Once, 3 of 6 cycles                 Past Medical History:   Diagnosis Date   ??? Asthma     exercise induced   ??? BRCA negative    ??? Chemotherapy follow-up examination     IP Chemo   ??? PONV (postoperative nausea and vomiting)       Past Surgical History:   Procedure Laterality Date   ??? CESAREAN SECTION      x3   ??? HYSTERECTOMY     ???  PR LAP, SURG ENTEROLYSIS N/A 01/18/2015    Procedure: ROBOTIC LAPAROSCOPY, SURGICAL, ENTEROLYSIS (FREEING OF INTESTINAL ADHESION) (SEPARATE PROCEDURE);  Surgeon: Laurence Aly, MD;  Location: MAIN OR [facility];  Service: Gynecology Oncology   ??? PR OOPH W/RADIC DISSECT FOR DEBULKING Midline 11/04/2013    Procedure: RESECTION (INITIAL) OVARIAN, TUBAL/PRIM PERITONEAL MALIG W/BIL S&O/OMENTECT; W/RAD DISSECTION FOR DEBULKING;  Surgeon: Laurence Aly, MD;  Location: MAIN OR [facility];  Service: Gynecology Oncology   ??? PR REMOVAL GALLBLADDER  11/04/2013    Procedure: CHOLECYSTECTOMY;  Surgeon: Laurence Aly, MD;  Location: MAIN OR [facility];  Service: Gynecology Oncology   ??? PR REMOVAL TUNNELED INTRAPERITONEAL CATHETER N/A 04/30/2014    Procedure: REMOVAL OF PERMANENT INTRAPERITONEAL CANNULA OR CATHETER;  Surgeon: Laurence Aly, MD;  Location: MAIN OR [facility];  Service: Gynecology Oncology   ??? PR REMOVAL, ABDOMEN LYMPH NODE,STAGING Bilateral 01/18/2015    Procedure: ROBOTIC XI LIMITED LYMPHADENECTOMY FOR STAGING (SEPARTE PROCEDURE); RETROPERITONEAL (AORTIC AND/OR SPLENIC);  Surgeon: Laurence Aly, MD;  Location: MAIN OR [facility]  Service: Gynecology Oncology        Family History   Problem Relation Age of Onset   ??? Breast cancer Maternal Aunt 68        currently 50   ??? Cancer Maternal Aunt         breast   ??? Anesthesia problems Neg Hx    ??? Thyroid disease Neg Hx    ??? Hypertension Neg Hx    ??? Diabetes Neg Hx       Family Status   Relation Status   ??? Mat Aunt (Not Specified)   ??? Neg Hx (Not Specified)     Social History     Occupational History   ??? Not on file.     Social History Main Topics   ??? Smoking status: Never Smoker   ??? Smokeless tobacco: Never Used   ??? Alcohol use No      Comment: occasional   ??? Drug use: No   ??? Sexual activity: Not on file       The following portions of the patient's history were reviewed and updated as appropriate: allergies, current medications, past family history, past medical history, past social history, past surgical history and problem list.    Review of Systems   Constitutional: Negative for appetite change, fatigue and unexpected weight change.   Gastrointestinal: Negative for abdominal distention, abdominal pain, blood in stool, constipation, diarrhea, nausea, rectal pain and vomiting.   Genitourinary: Negative for bladder incontinence, difficulty urinating, hematuria, nocturia, pelvic pain, vaginal bleeding and vaginal discharge.        Vital signs for this encounter:  BSA: There is no height or weight on file to calculate BSA.  LMP 10/23/2013     Physical Exam      Labs:    WBC   Date Value Ref Range Status   10/22/2017 4.8 10*9/L Final   01/19/2015 8.6 4.5 - 11.0 10*9/L Final     HGB   Date Value Ref Range Status   10/22/2017 10.5 g/dL Final   16/08/9603 54.0 (L) 12.0 - 16.0 g/dL Final     HCT   Date Value Ref Range Status   10/22/2017 32.9 % Final   01/19/2015 32.9 (L) 36.0 - 46.0 % Final     Platelet   Date Value Ref Range Status   10/22/2017 344 10*9/L Final   01/19/2015 226 150 - 440 10*9/L Final     Magnesium  Date Value Ref Range Status   10/04/2017 1.6 mg/dL Final     Creatinine Whole Blood, POC   Date Value Ref Range Status   04/23/2014 0.6 (L) 0.7 - 1.1 mg/dL Final     Creatinine, Serum   Date Value Ref Range Status   07/27/2017 0.57  Final     Creatinine   Date Value Ref Range Status   10/04/2017 0.53 mg/dL Final     AST   Date Value Ref Range Status   08/17/2017 17 14 - 38 U/L Final   04/24/2014 20 14 - 38 U/L Final     CA 125   Date Value Ref Range Status   10/26/2017 1,210.0 (H) 0.0 - 34.9 U/mL Final   10/05/2017 857.0 (H) 0.0 - 34.9 U/mL Final   09/06/2017 814.0 (H) 0.0 - 34.9 U/mL Final   08/17/2017 776.0 (H) 0.0 - 34.9 U/mL Final   07/29/2017 807.0 (H) 0.0 - 34.9 U/mL Final   07/08/2017 811.0 (H) 0.0 - 34.9 U/mL Final   06/16/2017 1,090.0 (H) 0.0 - 34.9 U/mL Final   05/25/2017 1,360.0 (H) 0.0 - 34.9 U/mL Final   05/04/2017 1,840.0 (H) 0.0 - 34.9 U/mL Final   04/20/2017 1,740.0 (H) 0.0 - 34.9 U/mL Final   03/23/2017 1,720.0 (H) 0.0 - 34.9 U/mL Final   03/05/2017 1,610.0 (H) 0.0 - 34.9 U/mL Final   02/12/2017 920.0 (H) 0.0 - 34.9 U/mL Final   01/26/2017 623.0 (H) 0.0 - 34.9 U/mL Final   01/01/2017 183.0 (H) 0.0 - 34.9 U/mL Final   12/18/2016 164.0 (H) 0.0 - 34.9 U/mL Final   12/01/2016 106.0 (H) 0.0 - 34.9 U/mL Final   10/23/2016 59.5 (H) 0.0 - 34.9 U/mL Final   09/22/2016 53.9 (H) 0.0 - 34.9 U/mL Final   08/27/2016 53.8 (H) 0.0 - 34.9 U/mL Final   07/23/2016 63.9 (H) 0.0 - 34.9 U/mL Final   06/19/2016 62.2 (H) 0.0 - 34.9 U/mL Final   06/18/2016 4.1 U/mL Final   06/02/2016 51.6 (H) 0.0 - 34.9 U/mL Final   05/13/2016 26.1 0.0 - 34.9 U/mL Final   05/04/2016 46.0 (H) 0.0 - 34.9 U/mL Final   04/22/2016 49.0 (H) 0.0 - 34.9 U/mL Final   04/02/2016 200.0 (H) 0.0 - 34.9 U/mL Final   03/13/2016 708.0 (H) 0.0 - 34.9 U/mL Final   02/21/2016 1,420.0 (H) 0.0 - 34.9 U/mL Final   02/13/2016 1,300.0 (H) 0.0 - 34.9 U/mL Final   10/15/2015 30.8 0.0 - 34.9 U/mL Final   09/24/2015 31.8 0.0 - 34.9 U/mL Final   08/27/2015 32.3 0.0 - 34.9 U/mL Final   07/23/2015 44.3 (H) 0.0 - 34.9 U/mL Final   06/25/2015 74.4 (H) 0.0 - 34.9 U/mL Final   05/31/2015 190.0 (H) 0.0 - 34.9 U/mL Final   05/01/2015 255.0 (H) 0.0 - 34.9 U/mL Final   04/29/2015 220.0 (H) 0.0 - 34.9 U/mL Final   04/10/2015 85.2 (H) 0.0 - 34.9 U/mL Final   04/02/2015 80.6 (H) 0.0 - 34.9 U/mL Final   03/12/2015 14.5 0.0 - 34.9 U/mL Final   02/19/2015 44.2 (H) 0.0 - 34.9 U/mL Final   12/28/2014 29.8 0.0 - 34.9 U/mL Final   11/27/2014 21.4 0.0 - 34.9 U/mL Final   08/21/2014 17.5 0.0 - 34.9 U/mL Final   07/09/2014 15.2 0.0 - 34.9 U/mL Final   04/24/2014 18.0 0.0 - 34.9 U/mL Final   04/23/2014 19.8 0.0 - 34.9 U/mL Final   03/20/2014 24.5 0.0 - 34.9 U/mL Final  02/27/2014 33.4 0.0 - 34.9 U/mL Final   01/09/2014 82.3 (H) 0.0 - 34.9 U/mL Final   12/19/2013 431.0 (H) 0.0 - 34.9 U/mL Final   11/21/2013 825.0 (H) 0.0 - 34.9 U/mL Final   11/03/2013 2,990.0 (H) 0.0 - 34.9 U/mL Final     CEA   Date Value Ref Range Status   11/03/2013 <0.5 0.0 - 5.0 ng/mL Final

## 2017-11-22 NOTE — Unmapped (Signed)
Encounter opened in error

## 2017-11-22 NOTE — Unmapped (Signed)
Pre-procedure instructions completed. All questions answered- verbalized understanding. OK to take AM medications.

## 2017-11-23 ENCOUNTER — Encounter: Admit: 2017-11-23 | Discharge: 2017-11-23 | Payer: PRIVATE HEALTH INSURANCE

## 2017-11-23 DIAGNOSIS — C579 Malignant neoplasm of female genital organ, unspecified: Principal | ICD-10-CM

## 2017-11-23 NOTE — Unmapped (Signed)
[place] INTERVENTIONAL RADIOLOGY - Pre Procedure H/P      Assessment/Plan:    Ms. Pringle is a 57 y.o. female who will undergo tunneled peritoneal drain placement in Interventional Radiology.    --This procedure has been fully reviewed with the patient/patient???s authorized representative. The risks, benefits and alternatives have been explained, and the patient/patient???s authorized representative has consented to the procedure.  --The patient will accept blood products in an emergent situation.  --The patient does not have a Do Not Resuscitate order in effect.      HPI: Ms. Mahurin is a 57 y.o. female with metastatic fallopian tube carcinoma with recurrent ascites. She reports she has required paracentesis with about 3-4 L removed monthly. She reports discomfort in her abdomen when she develops distension and that she has had difficulty fitting into her clothes. Her last paracentesis was about 4 weeks ago and she reports that her abdomen is more tense now than it was at that time.     Allergies: No Known Allergies    Medications:  No relevant medications, please see full medication list in Epic.    ASA Grade: ASA 3 - Patient with moderate systemic disease with functional limitations    PE:    Vitals:    11/23/17 1426   BP: 114/69   Pulse: 90   Resp: 18   Temp: 37 ??C (98.6 ??F)   SpO2: 99%     General: female in NAD.  Airway assessment: Class 2 - Can visualize soft palate and fauces, tip of uvula is obscured  Lungs: Respirations nonlabored  Abdomen: Moderate abdominal distension with probable fluid wave. NTTP.    Lawerance Sabal, MD  11/23/2017 2:52 PM

## 2017-11-24 NOTE — Unmapped (Signed)
VIR post procedure call completed. VM pick up. No message left.

## 2017-11-24 NOTE — Unmapped (Signed)
[place] INTERVENTION[facility] RADIOLOGY - Operative Note     VIR Post-Procedure Note    Procedure Name: Insertion of tunneled peritoneal catheter.     Pre-Op Diagnosis: Ovarian cancer    Post-Op Diagnosis: Same as pre-operative diagnosis    VIR Providers    Attending: Dr. Lissa Hoard  Resident/Fellow/APP: Jacqlyn Krauss, MD  Description of procedure: Tunneled peritoneal catheter was inserted in the left hemiabdomen.    Estimated Blood Loss: approximately 2 mL  Complications: None    See detailed procedure note with images in PACS [facility]).    The patient tolerated the procedure well without incident or complication and left the room in stable condition.    Jacqlyn Krauss, MD  11/23/2017 6:17 PM

## 2017-12-01 MED ORDER — ETOPOSIDE 50 MG CAPSULE
ORAL_CAPSULE | Freq: Every day | ORAL | 5 refills | 0.00000 days | Status: SS
Start: 2017-12-01 — End: 2018-02-08

## 2017-12-13 ENCOUNTER — Encounter: Admit: 2017-12-13 | Discharge: 2017-12-14 | Payer: PRIVATE HEALTH INSURANCE

## 2017-12-13 DIAGNOSIS — C5700 Malignant neoplasm of unspecified fallopian tube: Principal | ICD-10-CM

## 2017-12-13 LAB — COMPREHENSIVE METABOLIC PANEL
ALBUMIN: 3 g/dL — ABNORMAL LOW (ref 3.5–5.0)
ALKALINE PHOSPHATASE: 56 U/L (ref 38–126)
ALT (SGPT): 33 U/L (ref 15–48)
ANION GAP: 4 mmol/L — ABNORMAL LOW (ref 9–15)
AST (SGOT): 22 U/L (ref 14–38)
BILIRUBIN TOTAL: 0.2 mg/dL (ref 0.0–1.2)
BLOOD UREA NITROGEN: 12 mg/dL (ref 7–21)
BUN / CREAT RATIO: 21
CALCIUM: 9.5 mg/dL (ref 8.5–10.2)
CHLORIDE: 101 mmol/L (ref 98–107)
CO2: 35 mmol/L — ABNORMAL HIGH (ref 22.0–30.0)
CREATININE: 0.58 mg/dL — ABNORMAL LOW (ref 0.60–1.00)
EGFR MDRD AF AMER: >=60 mL/min/{1.73_m2} (ref >=60)
GLUCOSE RANDOM: 92 mg/dL (ref 65–179)
POTASSIUM: 4.6 mmol/L (ref 3.5–5.0)
PROTEIN TOTAL: 5.1 g/dL — ABNORMAL LOW (ref 6.5–8.3)
SODIUM: 140 mmol/L (ref 135–145)

## 2017-12-13 LAB — CBC W/ AUTO DIFF
BASOPHILS ABSOLUTE COUNT: 0 10*9/L (ref 0.0–0.1)
EOSINOPHILS ABSOLUTE COUNT: 0.1 10*9/L (ref 0.0–0.4)
HEMATOCRIT: 34.6 % — ABNORMAL LOW (ref 36.0–46.0)
HEMOGLOBIN: 11.5 g/dL — ABNORMAL LOW (ref 12.0–16.0)
LARGE UNSTAINED CELLS: 2 % (ref 0–4)
LYMPHOCYTES ABSOLUTE COUNT: 0.4 10*9/L — ABNORMAL LOW (ref 1.5–5.0)
MEAN CORPUSCULAR HEMOGLOBIN: 32.7 pg (ref 26.0–34.0)
MEAN CORPUSCULAR VOLUME: 98.4 fL (ref 80.0–100.0)
MEAN PLATELET VOLUME: 7.7 fL (ref 7.0–10.0)
MONOCYTES ABSOLUTE COUNT: 0.3 10*9/L (ref 0.2–0.8)
NEUTROPHILS ABSOLUTE COUNT: 3.2 10*9/L (ref 2.0–7.5)
PLATELET COUNT: 386 10*9/L (ref 150–440)
RED CELL DISTRIBUTION WIDTH: 15.5 % — ABNORMAL HIGH (ref 12.0–15.0)
WBC ADJUSTED: 4.2 10*9/L — ABNORMAL LOW (ref 4.5–11.0)

## 2017-12-13 LAB — MAGNESIUM: Magnesium:MCnc:Pt:Ser/Plas:Qn:: 1.2 — ABNORMAL LOW

## 2017-12-13 LAB — SODIUM: Sodium:SCnc:Pt:Ser/Plas:Qn:: 140

## 2017-12-13 LAB — CA 125: Cancer Ag 125:ACnc:Pt:Ser/Plas:Qn:: 3040 — ABNORMAL HIGH

## 2017-12-13 LAB — WBC ADJUSTED: Lab: 4.2 — ABNORMAL LOW

## 2017-12-14 LAB — FREE T4: Thyroxine.free:MCnc:Pt:Ser/Plas:Qn:: 1.34

## 2017-12-14 LAB — THYROID STIMULATING HORMONE: Thyrotropin:ACnc:Pt:Ser/Plas:Qn:: 14.23 — ABNORMAL HIGH

## 2017-12-31 ENCOUNTER — Encounter: Admit: 2017-12-31 | Discharge: 2018-01-01 | Payer: PRIVATE HEALTH INSURANCE

## 2017-12-31 ENCOUNTER — Encounter
Admit: 2017-12-31 | Discharge: 2018-01-01 | Payer: PRIVATE HEALTH INSURANCE | Attending: Gynecologic Oncology | Primary: Gynecologic Oncology

## 2017-12-31 DIAGNOSIS — C5700 Malignant neoplasm of unspecified fallopian tube: Principal | ICD-10-CM

## 2017-12-31 LAB — COMPREHENSIVE METABOLIC PANEL
ALKALINE PHOSPHATASE: 68 U/L (ref 38–126)
ALT (SGPT): 31 U/L (ref 15–48)
ANION GAP: 4 mmol/L — ABNORMAL LOW (ref 9–15)
BILIRUBIN TOTAL: 0.3 mg/dL (ref 0.0–1.2)
BLOOD UREA NITROGEN: 17 mg/dL (ref 7–21)
BUN / CREAT RATIO: 28
CALCIUM: 8.4 mg/dL — ABNORMAL LOW (ref 8.5–10.2)
CHLORIDE: 100 mmol/L (ref 98–107)
CO2: 33 mmol/L — ABNORMAL HIGH (ref 22.0–30.0)
CREATININE: 0.61 mg/dL (ref 0.60–1.00)
EGFR MDRD AF AMER: >=60 mL/min/{1.73_m2} (ref >=60)
EGFR MDRD NON AF AMER: >=60 mL/min/{1.73_m2} (ref >=60)
GLUCOSE RANDOM: 88 mg/dL (ref 65–99)
POTASSIUM: 4.4 mmol/L (ref 3.5–5.0)
PROTEIN TOTAL: 4.7 g/dL — ABNORMAL LOW (ref 6.5–8.3)
SODIUM: 137 mmol/L (ref 135–145)

## 2017-12-31 LAB — CBC W/ AUTO DIFF
BASOPHILS ABSOLUTE COUNT: 0 10*9/L (ref 0.0–0.1)
BASOPHILS RELATIVE PERCENT: 0.2 %
EOSINOPHILS ABSOLUTE COUNT: 0.2 10*9/L (ref 0.0–0.4)
EOSINOPHILS RELATIVE PERCENT: 3.7 %
HEMATOCRIT: 39.4 % (ref 36.0–46.0)
LARGE UNSTAINED CELLS: 1 % (ref 0–4)
LYMPHOCYTES ABSOLUTE COUNT: 0.4 10*9/L — ABNORMAL LOW (ref 1.5–5.0)
LYMPHOCYTES RELATIVE PERCENT: 7.2 %
MEAN CORPUSCULAR HEMOGLOBIN CONC: 31.4 g/dL (ref 31.0–37.0)
MEAN CORPUSCULAR HEMOGLOBIN: 31.3 pg (ref 26.0–34.0)
MEAN CORPUSCULAR VOLUME: 99.7 fL (ref 80.0–100.0)
MEAN PLATELET VOLUME: 8 fL (ref 7.0–10.0)
MONOCYTES ABSOLUTE COUNT: 0.2 10*9/L (ref 0.2–0.8)
MONOCYTES RELATIVE PERCENT: 2.5 %
NEUTROPHILS ABSOLUTE COUNT: 5.2 10*9/L (ref 2.0–7.5)
NEUTROPHILS RELATIVE PERCENT: 85.9 %
PLATELET COUNT: 469 10*9/L — ABNORMAL HIGH (ref 150–440)
RED BLOOD CELL COUNT: 3.96 10*12/L — ABNORMAL LOW (ref 4.00–5.20)
RED CELL DISTRIBUTION WIDTH: 15.8 % — ABNORMAL HIGH (ref 12.0–15.0)

## 2017-12-31 LAB — MAGNESIUM: Magnesium:MCnc:Pt:Ser/Plas:Qn:: 1.6

## 2017-12-31 LAB — CA 125: Cancer Ag 125:ACnc:Pt:Ser/Plas:Qn:: 4170 — ABNORMAL HIGH

## 2017-12-31 LAB — MEAN CORPUSCULAR HEMOGLOBIN CONC: Lab: 31.4

## 2017-12-31 LAB — ALT (SGPT): Alanine aminotransferase:CCnc:Pt:Ser/Plas:Qn:: 31

## 2017-12-31 NOTE — Unmapped (Signed)
Followup Visit Note      Patient Name: Anita Jones  Patient Age: 57 y.o.  Encounter Date: 12/31/2017    Assessment/Plan:    Here today in follow-up for recurrent progressive fallopian tube carcinoma.She is just returned from a wonderful trip in the [place] with her family.  We had placed in this she is just returned from a wonderful trip to the [place] with her family.  We had placed an Aspira drain prior to her trip and she used this to control her ascites.  PET/CT done today shows the following:      - Overall disease progression suspected with new metastatic disease in the left supraclavicular region, bilateral axillae, mediastinum, peritoneal implants and retrocrural and retroperitoneal lymph nodes.    - Increased uptake at the hysterectomy surgical site, slightly decreased in size compared to prior.    - Slight decreased size of previously described large volume ascites.    - Uptake in the abdominal wall musculature at the insertion site of the catheter, which is concerning for disease implant.    - Increased size of bilateral pleural effusions with new bilateral intense uptake, concerning for malignant effusion.    - New, diffuse FDG uptake along the liver capsule, concerning for metastases. FDG uptake adjacent to the falciform ligament is less conspicuous since prior.    I discussed these findings with her and her husband.  She feels great and actually has an outstanding performance status.  Her major symptom is her ascites which this.  Drain is helped significantly.  We have exhausted FDA approved drugs.  In reviewing her records we have landed on trying Olimpta.  She is in agreement.  We discussed potential side effects and schedule.  We will review her labs that were drawn today and plan to start treatment next week.  All of her questions were answered.    Reason for visit  Fallopian tube cancer    Patient is here to discuss ongoing plans for treatment.      I have independently reviewed the patient's Medical Records     Interval History:    Oncology History    Stage IIIC serous fallopian tube carcinoma    Chemo/rad  history  11/2013 - 04/2014 IP  docetaxel/carbo x 6  01/2015 - 04/2015 Gem/avastin x 3  05/2015 - 09/2015 doxil/carboplatin x 6  11/2015 -pelvic radiation  02/21/16-06/02/16 docetaxel/carbo x 6  06/25/16-zejula parp inhibitor  03/23/17- weekly paclitaxel x 2  05/04/17 -08/2017 docetaxel/carbo x 5, then taxol/carbon x1              Fallopian tube cancer, carcinoma (RAF-HCC)     11/03/2013  Initial Diagnosis  Transfer for [facility] as inpatient with abdominal distention, CT with omental caking and carcinomatosis. CA 125 2999     11/04/2013  Surgery  XL, TAH, BSO, omentectomy, appy, peritoneal stripping, bilatearl ureterolysis, cholecystectomy by surg onc     11/30/2013 - 04/20/2014  Chemotherapy  Docetaxel/carboplatin x 6. Neutropenia with ANC 0.3 after cycle 1. Recieved neulasta after subsequent cycles.     04/23/2014  Interval Scan(s)  CT abdomen/Pelvis: Small volume hypoattenuating material along the glallbladder fossa. Residual disease vs. complex fluid from chemotherapy. F/u exam recommended.     04/30/2014  Procedure  IP port removal     08/21/2014  No evidence of disease  normal exam. ca 125, 17.5     01/03/2015  Progression  PET: External iliac nodes, left greater than right, are  larger and more FDG avid compared to prior, concerning for malignancy. - Questionable small nodes in the cardiophrenic angles are indeterminate.     01/18/2015  Surgery  Robotic-assisted bilateral external iliac lymph node biopsy with resection of right pelvic brim peritoneal lesion and extensive enterolysis with primary small bowel repair. 1/3 positive lymph nodes      Chemotherapy  Gemzar/Avastin          3/16:  Diagnosis:  A: Right pelvic LN:   - 1 out of 3 lymph nodes shows metastatic adenocarcinoma consistent with  metastatic serous carcinoma (1/3)     B: Left pelvic LN:  - One lymph node with metastatic adenocarcinoma consistent with metastatic  serous carcinoma with tumor necrosis and focal extracapsular extension (B2)  (1/1)     C: Right pelvic sidewall, biopsy   - Fibroadipose tissue, fibrovascular tissue, and nerve, with fibrosis, no tumor  seen     6/16: PET/CT:  1. New, intensely FDG avid, enlarged periaortic lymph node (PET 81).   2. New, FDG avid left common iliac node is also seen (PET 60).   3. Multiple FDG avid left external iliac lymph nodes are identified, the most anterior node was previously more prominent.       Completed 6 cycles carboplatin + DOXIL with CR by PET/CT in 12/16        Gynecologic malignancy (CMS-HCC)    11/03/2013 Initial Diagnosis     Gynecologic malignancy           Fallopian tube cancer, carcinoma (CMS-HCC)    11/03/2013 Initial Diagnosis     Transfer for [facility] as inpatient with abdominal distention, CT with omental caking and carcinomatosis. CA 125 2999         11/04/2013 Surgery     XL, TAH, BSO, omentectomy, appy, peritoneal stripping, bilatearl ureterolysis, cholecystectomy by surg onc         11/30/2013 - 04/20/2014 Chemotherapy     Docetaxel/carboplatin x 6. Neutropenia with ANC 0.3 after cycle 1. Recieved neulasta after subsequent cycles.          12/06/2013 Genetics     Foundation One KRAS amplification: Trametinib         04/23/2014 Interval Scan(s)     CT abdomen/Pelvis: Small volume hypoattenuating material along the glallbladder fossa. Residual disease vs. complex fluid from chemotherapy. F/u exam recommended.         04/30/2014 Procedure     IP port removal         08/21/2014 No evidence of disease     normal exam. ca 125, 17.5         01/03/2015 Progression     PET: External iliac nodes, left greater than right, are larger and more FDG avid compared to prior, concerning for malignancy. - Questionable small nodes in the cardiophrenic angles are indeterminate.          01/18/2015 Surgery     Robotic-assisted bilateral external iliac lymph node biopsy with resection of right pelvic brim peritoneal lesion and extensive enterolysis with primary small bowel repair. 1/3 positive lymph nodes         01/23/2015 - 04/30/2015 Chemotherapy     Gemzar/Avastin x 3. Neulasta         04/29/2015 Progression     PET: New FDG avid left pelvic and retroperitoneal adenopathy, concerning for progression of disease..New focus of uptake in the left lower quadrant abdominal wall musculature is likely postprocedural.  05/31/2015 - 09/20/2015 Chemotherapy     Doxil/Carboplatin x 6  Neulasta         10/15/2015 Interval Scan(s)     PET: Resolution of FDG avid pelvic adenopathy, consistent with response to treatment. No new sites of recurrent or metastatic disease.          11/2015 - 12/2015 Radiation      cosolidation adiotherapy to the pelvis          02/17/2016 Progression     PET: Interval development of hepatic metastatic foci, multiple metastatic soft tissue implants throughout the peritoneum. These regions include perihepatic, perisplenic, scattered mesenteric, left lower abdominal musculature, and retroperitoneal/portacava         02/21/2016 - 06/02/2016 Chemotherapy     Taxotere/Carbo x 6         06/25/2016 -  Chemotherapy     Zejula (parp) 300mg  daily, added Keytruda 01/01/2017         03/09/2017 Progression     PET: Metastatic disease in a left supraclavicular lymph node, retroperitoneal lymph nodes and abdominal soft tissue implants suspected. New large volume ascites.  - Soft tissue with increased uptake at the cystectomy site, worrisome for local recurrence.         03/23/2017 -  Chemotherapy     Chemotherapy: weekly paclitaxel. Plan for 6 cycles         05/04/2017 -  Chemotherapy     Docetaxel/carboplatin plan 6 cycles         09/16/2017 -  Chemotherapy     Chemotherapy Treatment    Treatment Goal Curative   Line of Treatment [No plan line of treatment]   Plan Name OP OVARIAN DOCETAXEL/CARBOPLATIN   Start Date 11/29/2013   End Date 04/06/2014 (Planned)   Provider Joesphine Bare, NP Chemotherapy dexamethasone (DECADRON) tablet 12 mg, 12 mg, Oral, Once, 3 of 7 cycles    CARBOplatin (PARAPLATIN) 729.5 mg IVPB, 729.5 mg (100 % of original dose 729.5 mg), Intravenous, Once, 3 of 7 cycles  Dose modification: 729.5 mg (original dose 729.5 mg, Cycle 1)    DOCEtaxel (TAXOTERE) 130.5 mg in sodium chloride 0.9% NON-PVC (NS) 250 mL IVPB, 75 mg/m2 = 130.5 mg, Intravenous, Once, 3 of 7 cycles       Chemotherapy Treatment    Treatment Goal Curative   Line of Treatment [No plan line of treatment]   Plan Name Fallopian Tube OP Taxil/Cisplatin Intraperitoneal   Start Date 02/27/2014 (Planned)   End Date 03/06/2014 (Planned)   Provider Joesphine Bare, NP   Chemotherapy [No matching medication found in this treatment plan]     Chemotherapy Treatment    Treatment Goal Curative   Line of Treatment [No plan line of treatment]   Plan Name OP Fallopian Tube PACLITAXEL/Cisplatin intaperineal and intravenous   Start Date 02/27/2014   End Date 03/27/2014   Provider Joesphine Bare, NP   Chemotherapy dexamethasone (DECADRON) tablet 12 mg, 12 mg, Oral, Once, 2 of 2 cycles    CISplatin (PLATINOL) 126 mg in sodium chloride (NS) 0.9 % 1,000 mL IVPB, 75 mg/m2 = 126 mg (100 % of original dose 75 mg/m2), Intraperitoneal, Once, 2 of 2 cycles  Dose modification: 75 mg/m2 (original dose 75 mg/m2, Cycle 1), 75 mg/m2 (original dose 75 mg/m2, Cycle 1)    PACLitaxel (TAXOL) 227 mg in sodium chloride 0.9% NON-PVC (NS) 250 mL IVPB, 135 mg/m2 = 227 mg (100 % of original dose 135 mg/m2), Intravenous, Once, 2 of 2 cycles  Dose modification: 135 mg/m2 (original dose 60 mg/m2, Cycle 1, Reason: Other (See Comments), Comment: per intraperitoneal protocol.), 60 mg/m2 (original dose 60 mg/m2, Cycle 1, Reason: Other (See Comments)), 60 mg/m2 (original dose 135 mg/m2, Cycle 2, Reason: Other (See Comments))       Chemotherapy Treatment    Treatment Goal Curative   Line of Treatment [No plan line of treatment]   Plan Name OP OVARIAN GEMCITABINE (21 DAYS)   Start Date 02/19/2015   End Date 06/11/2015 (Planned)   Provider Laurence Aly, MD   Chemotherapy dexamethasone (DECADRON) tablet 8 mg, 8 mg, Oral, Once, 3 of 6 cycles    bevacizumab (AVASTIN) 1,095 mg in sodium chloride (NS) 0.9 % 100 mL IVPB, 15 mg/kg = 1,095 mg (100 % of original dose 15 mg/kg), Intravenous, Once, 3 of 6 cycles  Dose modification: 15 mg/kg (original dose 15 mg/kg, Cycle 1)    gemcitabine (GEMZAR) 1,790.11 mg in sodium chloride (NS) 0.9 % 250 mL IVPB, 1,000 mg/m2 = 1,790.11 mg, Intravenous, Once, 3 of 6 cycles       Chemotherapy Treatment    Treatment Goal Curative   Line of Treatment [No plan line of treatment]   Plan Name OP OVARIAN DOXORUBICIN LIPOSOMAL/CARBOPLATIN   Start Date 05/03/2015   End Date 09/24/2015   Provider Laurence Aly, MD   Chemotherapy dexamethasone (DECADRON) tablet 12 mg, 12 mg, Oral, Once, 6 of 6 cycles    CARBOplatin (PARAPLATIN) 750 mg IVPB, 750 mg (100 % of original dose 750 mg), Intravenous, Once, 6 of 6 cycles  Dose modification:   (original dose 750 mg, Cycle 1),   (original dose 674 mg, Cycle 2),   (original dose 674 mg, Cycle 3), 690 mg (original dose 674 mg, Cycle 6, Reason: Other (See Comments), Comment: based on previous creatinine on 10/16 of .65)    DOXOrubicin liposome (DOXIL) 52 mg in dextrose 5 % 250 mL IVPB, 30 mg/m2 = 52 mg, Intravenous, Once, 6 of 6 cycles       Chemotherapy Treatment    Treatment Goal Control   Line of Treatment [No plan line of treatment]   Plan Name OP OVARIAN DOCETAXEL/CARBOPLATIN   Start Date 02/21/2016   End Date 06/02/2016   Provider Maurie Boettcher, MD   Chemotherapy dexamethasone (DECADRON) tablet 12 mg, 12 mg, Oral, Once, 6 of 6 cycles    CARBOplatin (PARAPLATIN) 750 mg IVPB, 750 mg (112.4 % of original dose 667 mg), Intravenous, Once, 6 of 6 cycles  Dose modification:   (original dose 667 mg, Cycle 1), 750 mg (original dose 667 mg, Cycle 1)    DOCEtaxel (TAXOTERE) 139.5 mg in sodium chloride 0.9% NON-PVC (NS) 250 mL IVPB, 75 mg/m2 = 139.5 mg, Intravenous, Once, 6 of 6 cycles       Chemotherapy Treatment    Treatment Goal Control   Line of Treatment [No plan line of treatment]   Plan Name OP OVARIAN PEMBROLIZUMAB   Start Date 01/01/2017   End Date 06/01/2017 (Planned)   Provider Maurie Boettcher, MD   Chemotherapy pembrolizumab [facility]) 200 mg in sodium chloride (NS) 0.9 % 50 mL IVPB, 200 mg, Intravenous, Once, 3 of 8 cycles       Chemotherapy Treatment    Treatment Goal Control   Line of Treatment [No plan line of treatment]   Plan Name OP OVARIAN PACLITAXEL (WEEKLY)   Start Date 03/23/2017   End Date 07/27/2017 (Planned)   Provider Maurie Boettcher, MD  Chemotherapy dexamethasone (DECADRON) tablet 20 mg, 20 mg, Oral, Once, 2 of 6 cycles    PACLItaxel (TAXOL) 140.82 mg in sodium chloride 0.9% NON-PVC (NS) 250 mL IVPB, 80 mg/m2 = 140.82 mg, Intravenous, Once, 2 of 6 cycles       Chemotherapy Treatment    Treatment Goal Control   Line of Treatment [No plan line of treatment]   Plan Name OP OVARIAN PACLITAXEL/CARBOPLATIN   Start Date 04/28/2017 (Planned)   End Date 08/11/2017 (Planned)   Provider Maurie Boettcher, MD   Chemotherapy dexamethasone (DECADRON) tablet 12 mg, 12 mg, Oral, Once, 0 of 6 cycles    CARBOplatin (PARAPLATIN) 812.4 mg in sodium chloride (NS) 0.9% 100 mL IVPB, 812.4 mg (100 % of original dose 812.4 mg), Intravenous, Once, 0 of 6 cycles  Dose modification:   (original dose 812.4 mg, Cycle 1), 812.4 mg (original dose 812.4 mg, Cycle 1)    PACLItaxel (TAXOL) 311.52 mg in sodium chloride 0.9% NON-PVC (NS) 500 mL IVPB, 175 mg/m2 = 311.52 mg, Intravenous, Once, 0 of 6 cycles       Chemotherapy Treatment    Treatment Goal Control   Line of Treatment [No plan line of treatment]   Plan Name OP OVARIAN DOCETAXEL/CARBOPLATIN   Start Date 05/04/2017   End Date 07/08/2017   Provider Maurie Boettcher, MD   Chemotherapy dexamethasone (DECADRON) tablet 12 mg, 12 mg, Oral, Once, 4 of 4 cycles CARBOplatin (PARAPLATIN) 629.5 mg IVPB, 629.5 mg (100 % of original dose 629.5 mg), Intravenous, Once, 4 of 4 cycles  Dose modification:   (original dose 629.5 mg, Cycle 1)    DOCEtaxel (TAXOTERE) 133.5 mg in sodium chloride 0.9% NON-PVC (NS) 250 mL IVPB, 75 mg/m2 = 133.5 mg, Intravenous, Once, 4 of 4 cycles       Chemotherapy Treatment    Treatment Goal Control   Line of Treatment [No plan line of treatment]   Plan Name OP OVARIAN PACLITAXEL/CARBOPLATIN   Start Date 07/28/2017   End Date 08/20/2017   Provider Maurie Boettcher, MD   Chemotherapy dexamethasone (DECADRON) tablet 12 mg, 12 mg, Oral, Once, 2 of 2 cycles    CARBOplatin (PARAPLATIN) 900 mg IVPB, 900 mg (104.1 % of original dose 864.6 mg), Intravenous, Once, 2 of 2 cycles  Dose modification:   (original dose 864.6 mg, Cycle 1)    PACLItaxel (TAXOL) 315 mg in sodium chloride 0.9% NON-PVC (NS) 500 mL IVPB, 175 mg/m2 = 315 mg, Intravenous, Once, 2 of 2 cycles       Chemotherapy Treatment    Treatment Goal Control   Line of Treatment [No plan line of treatment]   Plan Name OP OVARIAN BEVACIZUMAB   Start Date 09/16/2017   End Date 12/28/2017 (Planned)   Provider Maurie Boettcher, MD   Chemotherapy bevacizumab (AVASTIN) 1,056 mg in sodium chloride (NS) 0.9% 100 mL IVPB, 15 mg/kg = 1,056 mg, Intravenous, Once, 3 of 6 cycles                 Past Medical History:   Diagnosis Date   ??? Asthma     exercise induced   ??? BRCA negative    ??? Cancer (CMS-HCC)     ovarian   ??? Chemotherapy follow-up examination     IP Chemo   ??? Disease of thyroid gland     hypothyroid   ??? PONV (postoperative nausea and vomiting)       Past Surgical History:   Procedure Laterality Date   ???  CESAREAN SECTION      x3   ??? HYSTERECTOMY     ??? IR INSERT TUNNEL INTRAPERITONEAL CATH  11/23/2017    IR INSERT TUNNEL INTRAPERITONEAL CATH 11/23/2017 Carolin Coy, MD IMG VIR [facility]   ??? PR LAP, SURG ENTEROLYSIS N/A 01/18/2015    Procedure: ROBOTIC LAPAROSCOPY, SURGICAL, ENTEROLYSIS (FREEING OF INTESTINAL ADHESION) (SEPARATE PROCEDURE);  Surgeon: Laurence Aly, MD;  Location: MAIN OR [facility];  Service: Gynecology Oncology   ??? PR OOPH W/RADIC DISSECT FOR DEBULKING Midline 11/04/2013    Procedure: RESECTION (INITIAL) OVARIAN, TUBAL/PRIM PERITONEAL MALIG W/BIL S&O/OMENTECT; W/RAD DISSECTION FOR DEBULKING;  Surgeon: Laurence Aly, MD;  Location: MAIN OR [facility];  Service: Gynecology Oncology   ??? PR REMOVAL GALLBLADDER  11/04/2013    Procedure: CHOLECYSTECTOMY;  Surgeon: Laurence Aly, MD;  Location: MAIN OR [facility];  Service: Gynecology Oncology   ??? PR REMOVAL TUNNELED INTRAPERITONEAL CATHETER N/A 04/30/2014    Procedure: REMOVAL OF PERMANENT INTRAPERITONEAL CANNULA OR CATHETER;  Surgeon: Laurence Aly, MD;  Location: MAIN OR [facility];  Service: Gynecology Oncology   ??? PR REMOVAL, ABDOMEN LYMPH NODE,STAGING Bilateral 01/18/2015    Procedure: ROBOTIC XI LIMITED LYMPHADENECTOMY FOR STAGING (SEPARTE PROCEDURE); RETROPERITONEAL (AORTIC AND/OR SPLENIC);  Surgeon: Laurence Aly, MD;  Location: MAIN OR [facility];  Service: Gynecology Oncology        Family History   Problem Relation Age of Onset   ??? Breast cancer Maternal Aunt 68        currently 62   ??? Cancer Maternal Aunt         breast   ??? Anesthesia problems Neg Hx    ??? Thyroid disease Neg Hx    ??? Hypertension Neg Hx    ??? Diabetes Neg Hx       Family Status   Relation Status   ??? Mat Aunt (Not Specified)   ??? Neg Hx (Not Specified)     Social History     Occupational History   ??? Not on file.     Social History Main Topics   ??? Smoking status: Never Smoker   ??? Smokeless tobacco: Never Used   ??? Alcohol use No      Comment: occasional   ??? Drug use: No   ??? Sexual activity: Not on file       The following portions of the patient's history were reviewed and updated as appropriate: allergies, current medications, past family history, past medical history, past social history, past surgical history and problem list.    Review of Systems   Constitutional: Negative for appetite change, fatigue and unexpected weight change.   Gastrointestinal: Negative for abdominal distention, abdominal pain, blood in stool, constipation, diarrhea, nausea, rectal pain and vomiting.   Genitourinary: Negative for bladder incontinence, difficulty urinating, hematuria, nocturia, pelvic pain, vaginal bleeding and vaginal discharge.        Vital signs for this encounter:  BSA: 1.73 meters squared  BP 109/77  - Pulse 113  - Temp 36.8 ??C (98.3 ??F) (Oral)  - Wt 68.9 kg (151 lb 14.4 oz)  - LMP 10/23/2013  - SpO2 98%  - BMI 27.88 kg/m??      Physical Exam      Labs:    WBC   Date Value Ref Range Status   12/13/2017 4.2 (L) 4.5 - 11.0 10*9/L Final   10/22/2017 4.8 10*9/L Final   01/19/2015 8.6 4.5 - 11.0 10*9/L Final     HGB   Date Value  Ref Range Status   12/13/2017 11.5 (L) 12.0 - 16.0 g/dL Final   09/81/1914 78.2 (L) 12.0 - 16.0 g/dL Final     HCT   Date Value Ref Range Status   12/13/2017 34.6 (L) 36.0 - 46.0 % Final   01/19/2015 32.9 (L) 36.0 - 46.0 % Final     Platelet   Date Value Ref Range Status   12/13/2017 386 150 - 440 10*9/L Final   01/19/2015 226 150 - 440 10*9/L Final     Magnesium   Date Value Ref Range Status   12/13/2017 1.2 (L) 1.6 - 2.2 mg/dL Final   95/62/1308 1.6 mg/dL Final     Creatinine Whole Blood, POC   Date Value Ref Range Status   04/23/2014 0.6 (L) 0.7 - 1.1 mg/dL Final     Creatinine, Serum   Date Value Ref Range Status   07/27/2017 0.57  Final     Creatinine   Date Value Ref Range Status   12/13/2017 0.58 (L) 0.60 - 1.00 mg/dL Final   65/78/4696 2.95 mg/dL Final     AST   Date Value Ref Range Status   12/13/2017 22 14 - 38 U/L Final   04/24/2014 20 14 - 38 U/L Final     CA 125   Date Value Ref Range Status   12/13/2017 3,040.0 (H) 0.0 - 34.9 U/mL Final   11/19/2017 2,350.0 (H) 0.0 - 34.9 U/mL Final   10/26/2017 1,210.0 (H) 0.0 - 34.9 U/mL Final   10/05/2017 857.0 (H) 0.0 - 34.9 U/mL Final   09/06/2017 814.0 (H) 0.0 - 34.9 U/mL Final   08/17/2017 776.0 (H) 0.0 - 34.9 U/mL Final   07/29/2017 807.0 (H) 0.0 - 34.9 U/mL Final   07/08/2017 811.0 (H) 0.0 - 34.9 U/mL Final   06/16/2017 1,090.0 (H) 0.0 - 34.9 U/mL Final   05/25/2017 1,360.0 (H) 0.0 - 34.9 U/mL Final   05/04/2017 1,840.0 (H) 0.0 - 34.9 U/mL Final   04/20/2017 1,740.0 (H) 0.0 - 34.9 U/mL Final   03/23/2017 1,720.0 (H) 0.0 - 34.9 U/mL Final   03/05/2017 1,610.0 (H) 0.0 - 34.9 U/mL Final   02/12/2017 920.0 (H) 0.0 - 34.9 U/mL Final   01/26/2017 623.0 (H) 0.0 - 34.9 U/mL Final   01/01/2017 183.0 (H) 0.0 - 34.9 U/mL Final   12/18/2016 164.0 (H) 0.0 - 34.9 U/mL Final   12/01/2016 106.0 (H) 0.0 - 34.9 U/mL Final   10/23/2016 59.5 (H) 0.0 - 34.9 U/mL Final   09/22/2016 53.9 (H) 0.0 - 34.9 U/mL Final   08/27/2016 53.8 (H) 0.0 - 34.9 U/mL Final   07/23/2016 63.9 (H) 0.0 - 34.9 U/mL Final   06/19/2016 62.2 (H) 0.0 - 34.9 U/mL Final   06/18/2016 4.1 U/mL Final   06/02/2016 51.6 (H) 0.0 - 34.9 U/mL Final   05/13/2016 26.1 0.0 - 34.9 U/mL Final   05/04/2016 46.0 (H) 0.0 - 34.9 U/mL Final   04/22/2016 49.0 (H) 0.0 - 34.9 U/mL Final   04/02/2016 200.0 (H) 0.0 - 34.9 U/mL Final   03/13/2016 708.0 (H) 0.0 - 34.9 U/mL Final   02/21/2016 1,420.0 (H) 0.0 - 34.9 U/mL Final   02/13/2016 1,300.0 (H) 0.0 - 34.9 U/mL Final   10/15/2015 30.8 0.0 - 34.9 U/mL Final   09/24/2015 31.8 0.0 - 34.9 U/mL Final   08/27/2015 32.3 0.0 - 34.9 U/mL Final   07/23/2015 44.3 (H) 0.0 - 34.9 U/mL Final   06/25/2015 74.4 (H) 0.0 - 34.9 U/mL Final  05/31/2015 190.0 (H) 0.0 - 34.9 U/mL Final   05/01/2015 255.0 (H) 0.0 - 34.9 U/mL Final   04/29/2015 220.0 (H) 0.0 - 34.9 U/mL Final   04/10/2015 85.2 (H) 0.0 - 34.9 U/mL Final   04/02/2015 80.6 (H) 0.0 - 34.9 U/mL Final   03/12/2015 14.5 0.0 - 34.9 U/mL Final   02/19/2015 44.2 (H) 0.0 - 34.9 U/mL Final   12/28/2014 29.8 0.0 - 34.9 U/mL Final   11/27/2014 21.4 0.0 - 34.9 U/mL Final   08/21/2014 17.5 0.0 - 34.9 U/mL Final   07/09/2014 15.2 0.0 - 34.9 U/mL Final   04/24/2014 18.0 0.0 - 34.9 U/mL Final   04/23/2014 19.8 0.0 - 34.9 U/mL Final   03/20/2014 24.5 0.0 - 34.9 U/mL Final   02/27/2014 33.4 0.0 - 34.9 U/mL Final   01/09/2014 82.3 (H) 0.0 - 34.9 U/mL Final   12/19/2013 431.0 (H) 0.0 - 34.9 U/mL Final   11/21/2013 825.0 (H) 0.0 - 34.9 U/mL Final   11/03/2013 2,990.0 (H) 0.0 - 34.9 U/mL Final     CEA   Date Value Ref Range Status   11/03/2013 <0.5 0.0 - 5.0 ng/mL Final

## 2017-12-31 NOTE — Unmapped (Signed)
Patient called and was able to get her PET scan moved to tomorrow 12/31/17 @11 :30 am. Patient has been having increased abdominal pain and is draining 1L of fluid from her aspira drain every 2-3 days. She will be added onto Dr. Ruthe Mannan' clinic in the afternoon to review the results.

## 2018-01-03 ENCOUNTER — Encounter: Admit: 2018-01-03 | Discharge: 2018-01-04 | Payer: PRIVATE HEALTH INSURANCE

## 2018-01-03 DIAGNOSIS — C5700 Malignant neoplasm of unspecified fallopian tube: Principal | ICD-10-CM

## 2018-01-03 MED ORDER — DEXAMETHASONE 4 MG TABLET
ORAL_TABLET | 0 refills | 0 days | Status: CP
Start: 2018-01-03 — End: ?

## 2018-01-03 MED ORDER — PROCHLORPERAZINE MALEATE 10 MG TABLET
ORAL_TABLET | Freq: Four times a day (QID) | ORAL | 2 refills | 0 days | Status: CP | PRN
Start: 2018-01-03 — End: ?

## 2018-01-03 NOTE — Unmapped (Signed)
Patient presented for cycle # 1 pemetrexed chemotherapy. Patient started folic acid Friday 12-31-17.  No orders for premedication with decadron given . Anita Jones consulted and decision made to HOLD chemo today and reschedule for Thursday 2-28.Pt will receive B 12 injection today and start 4 mg dexamethasone BID  2-27 and understands to continue for  3 days each cycle.B.Jacklynn Bue RN

## 2018-01-04 NOTE — Unmapped (Signed)
Pharmacy: First Cycle Chemotherapy Patient Education    Chemotherapy regimen/agents: Pemetrexed  Day 1 of chemotherapy: 01/06/18  Caregivers present for education: Spouse    Anita Jones is a 57 y.o. with fallopian tube cancer who will receive their first cycle of pemetrexed. Patient education reviewing the chemotherapy is provided to the patient by their oncology pharmacist.      Objective:   For each of the following items listed below and where applicable, signs and symptoms of adverse effects as well as self-care management were reviewed. Additionally, chemotherapy agents, drug-drug/food/herbal interactions and supportive care measures were also reviewed with the patient. Adverse effects discussed included:    ? Neutropenia/Febrile Neutropenia  ? Thrombocytopenia  ? Anemia  ? Nausea/Vomiting (low risk)  ? Fatigue  ? GI toxicity  ? Rash    The teach-back method was used and the patient verbalized understanding of this information. The full chemotherapy counseling was provided by the nurse clinician. I educated the patient on supportive care measures and the importance of taking folic acid, vitamin B12, and dexamethasone prior to receiving cycle 1 pemetrexed.     Approximate face time spent with patient: 10 minutes.    Thank you, please feel free to page with any questions or concerns.    Jackelyn Knife, PharmD, BCOP, CPP  [facility] Pharmacist  Pager: [phone]

## 2018-01-06 ENCOUNTER — Encounter: Admit: 2018-01-06 | Discharge: 2018-01-07 | Payer: PRIVATE HEALTH INSURANCE

## 2018-01-06 DIAGNOSIS — C5700 Malignant neoplasm of unspecified fallopian tube: Principal | ICD-10-CM

## 2018-01-06 DIAGNOSIS — Z9889 Other specified postprocedural states: Secondary | ICD-10-CM

## 2018-01-25 ENCOUNTER — Encounter
Admit: 2018-01-25 | Discharge: 2018-01-26 | Payer: PRIVATE HEALTH INSURANCE | Attending: Gynecologic Oncology | Primary: Gynecologic Oncology

## 2018-01-25 DIAGNOSIS — C5700 Malignant neoplasm of unspecified fallopian tube: Principal | ICD-10-CM

## 2018-01-25 LAB — CBC W/ AUTO DIFF
BASOPHILS RELATIVE PERCENT: 0.4 %
EOSINOPHILS RELATIVE PERCENT: 0.9 %
HEMATOCRIT: 36.8 % (ref 36.0–46.0)
HEMOGLOBIN: 12 g/dL (ref 12.0–16.0)
LARGE UNSTAINED CELLS: 1 % (ref 0–4)
LYMPHOCYTES ABSOLUTE COUNT: 0.6 10*9/L — ABNORMAL LOW (ref 1.5–5.0)
LYMPHOCYTES RELATIVE PERCENT: 6.9 %
MEAN CORPUSCULAR HEMOGLOBIN CONC: 32.7 g/dL (ref 31.0–37.0)
MEAN CORPUSCULAR VOLUME: 97.9 fL (ref 80.0–100.0)
MEAN PLATELET VOLUME: 8 fL (ref 7.0–10.0)
MONOCYTES ABSOLUTE COUNT: 0.5 10*9/L (ref 0.2–0.8)
MONOCYTES RELATIVE PERCENT: 5.9 %
NEUTROPHILS ABSOLUTE COUNT: 7.5 10*9/L (ref 2.0–7.5)
NEUTROPHILS RELATIVE PERCENT: 84.6 %
PLATELET COUNT: 661 10*9/L — ABNORMAL HIGH (ref 150–440)
RED BLOOD CELL COUNT: 3.76 10*12/L — ABNORMAL LOW (ref 4.00–5.20)
RED CELL DISTRIBUTION WIDTH: 17 % — ABNORMAL HIGH (ref 12.0–15.0)
WBC ADJUSTED: 8.9 10*9/L (ref 4.5–11.0)

## 2018-01-25 LAB — COMPREHENSIVE METABOLIC PANEL
ALBUMIN: 2.7 g/dL — ABNORMAL LOW (ref 3.5–5.0)
ALKALINE PHOSPHATASE: 85 U/L (ref 38–126)
ALT (SGPT): 36 U/L (ref 15–48)
ANION GAP: 5 mmol/L — ABNORMAL LOW (ref 9–15)
BILIRUBIN TOTAL: 0.3 mg/dL (ref 0.0–1.2)
BLOOD UREA NITROGEN: 17 mg/dL (ref 7–21)
BUN / CREAT RATIO: 17
CALCIUM: 9.5 mg/dL (ref 8.5–10.2)
CHLORIDE: 98 mmol/L (ref 98–107)
CO2: 34 mmol/L — ABNORMAL HIGH (ref 22.0–30.0)
CREATININE: 0.98 mg/dL (ref 0.60–1.00)
EGFR MDRD AF AMER: >=60 mL/min/{1.73_m2} (ref >=60)
EGFR MDRD NON AF AMER: 58 mL/min/{1.73_m2} — ABNORMAL LOW (ref >=60)
GLUCOSE RANDOM: 111 mg/dL — ABNORMAL HIGH (ref 65–99)
PROTEIN TOTAL: 4.9 g/dL — ABNORMAL LOW (ref 6.5–8.3)
SODIUM: 137 mmol/L (ref 135–145)

## 2018-01-25 LAB — URINALYSIS
BACTERIA: NONE SEEN /HPF
BILIRUBIN UA: NEGATIVE
BLOOD UA: NEGATIVE
GLUCOSE UA: NEGATIVE
KETONES UA: NEGATIVE
NITRITE UA: NEGATIVE
PROTEIN UA: 30 — AB
RBC UA: 1 /HPF (ref <=4)
SPECIFIC GRAVITY UA: 1.023 (ref 1.003–1.030)
SQUAMOUS EPITHELIAL: <1 /HPF (ref 0–5)
UROBILINOGEN UA: 0.2
WBC UA: 5 /HPF (ref 0–5)

## 2018-01-25 LAB — MUCUS

## 2018-01-25 LAB — MAGNESIUM: Magnesium:MCnc:Pt:Ser/Plas:Qn:: 1.8

## 2018-01-25 LAB — CA 125: Cancer Ag 125:ACnc:Pt:Ser/Plas:Qn:: 6020 — ABNORMAL HIGH

## 2018-01-25 LAB — CO2: Carbon dioxide:SCnc:Pt:Ser/Plas:Qn:: 34 — ABNORMAL HIGH

## 2018-01-25 LAB — LYMPHOCYTES RELATIVE PERCENT: Lab: 6.9

## 2018-01-25 MED ORDER — METOCLOPRAMIDE 5 MG TABLET
ORAL_TABLET | Freq: Three times a day (TID) | ORAL | 2 refills | 0 days | Status: SS
Start: 2018-01-25 — End: 2018-02-08

## 2018-01-25 MED ORDER — ONDANSETRON HCL 8 MG TABLET
ORAL_TABLET | Freq: Three times a day (TID) | ORAL | 5 refills | 0 days | Status: CP | PRN
Start: 2018-01-25 — End: 2019-01-25

## 2018-01-25 MED ORDER — LORAZEPAM 0.5 MG TABLET
ORAL_TABLET | Freq: Four times a day (QID) | ORAL | 0 refills | 0 days | Status: CP | PRN
Start: 2018-01-25 — End: 2019-01-25

## 2018-01-25 MED ORDER — OXYCODONE 5 MG TABLET
ORAL_TABLET | Freq: Three times a day (TID) | ORAL | 0 refills | 0.00000 days | Status: CP | PRN
Start: 2018-01-25 — End: 2019-01-25

## 2018-01-25 NOTE — Unmapped (Addendum)
It was great to see you and your husband today. There wasn't much additional fluid on ultrasound today and it seems you are moving your bowels well.    Let's do a few things:  1. Add reglan to help keep your bowels moving as sometimes cancer can cause your bowels to slow down. While you start this, hold the senna and see what your bowels do. If you go two days without a BM restart senna as you usually take    2. Take tylenol as needed for pain    3. Start oxycodone 5 mg as needed every 4-6 hours for pain. If you are taking several per day, let us know and we can start a long acting medication    4. Ativan was prescribed today. Take as needed     Please call us with worsening pain, no BM x 3 days, worsening shortness of breath, fevers, chills or any other new symptoms you are worried about

## 2018-01-25 NOTE — Unmapped (Signed)
GYNECOLOGIC ONCOLOGY RETURN VISIT    January 25, 2018 2:01 PM    ASSESSMENT AND PLAN:  Anita Jones is a 57 y.o. woman with recurrent progressive fallopian tube carcinoma, currently receiving Alimta (cycle 1 2/28) presenting with abdominal pain and bloating likely secondary to disease burden. No significant ascites on BSUS and accessing Aspira regularly with good output. No rebound or guarding, normal WBC; not consistent with SBP. History and exam not consistent with SBO/ileus as is passing flatus and having daily bowel movements; no nausea or vomiting or distention. May be component of dysmotility due to carcinomatosis.  -Add reglan scheduled to assess if aids with bowel motility. For now will hold senna and assess regularity of bowel movements. If no BM in 1-2 days will add senna back  -Will start tylenol PRN but suspect will not be sufficient for pain control. Rx given for oxycodone and encouraged taking PRN. If using more frequently would consider long acting medication. She is reluctant to take narcotics but discussed she may need to in order to control the pain to function as best as possible  -Ativan Rx given for PRN use  -Discussed nutrition as alb 2.7. She is drinking 1 shake per day and is going to try to increase to 2 if able. If she feels bloated or uncomfortable will back off. Offered nutrition consult which she would like to defer at this time  -UA/Cx sent given left shift on CBC but as she is otherwise well appearing, blood cultures deferred. Advised if fevers, chills, other s/s of infection she will call and be seen in the office  -Return for chemotherapy, will repeat labs at that visit  -Will see Dr Ruthe Mannan early April  -Will call sooner with issues    Dr Duard Brady immediately available    REASON FOR VISIT: abdominal pain    TREATMENT HISTORY:  Oncology History    Stage IIIC serous fallopian tube carcinoma    Chemo/rad  history  11/2013 - 04/2014 IP  docetaxel/carbo x 6  01/2015 - 04/2015 Gem/avastin x 3 05/2015 - 09/2015 doxil/carboplatin x 6  11/2015 -pelvic radiation  02/21/16-06/02/16 docetaxel/carbo x 6  06/25/16-zejula parp inhibitor  03/23/17- weekly paclitaxel x 2  05/04/17 -08/2017 docetaxel/carbo x 5, then taxol/carbon x1              Fallopian tube cancer, carcinoma (RAF-HCC)     11/03/2013  Initial Diagnosis  Transfer for [facility] as inpatient with abdominal distention, CT with omental caking and carcinomatosis. CA 125 2999     11/04/2013  Surgery  XL, TAH, BSO, omentectomy, appy, peritoneal stripping, bilatearl ureterolysis, cholecystectomy by surg onc     11/30/2013 - 04/20/2014  Chemotherapy  Docetaxel/carboplatin x 6. Neutropenia with ANC 0.3 after cycle 1. Recieved neulasta after subsequent cycles.     04/23/2014  Interval Scan(s)  CT abdomen/Pelvis: Small volume hypoattenuating material along the glallbladder fossa. Residual disease vs. complex fluid from chemotherapy. F/u exam recommended.     04/30/2014  Procedure  IP port removal     08/21/2014  No evidence of disease  normal exam. ca 125, 17.5     01/03/2015  Progression  PET: External iliac nodes, left greater than right, are larger and more FDG avid compared to prior, concerning for malignancy. - Questionable small nodes in the cardiophrenic angles are indeterminate.     01/18/2015  Surgery  Robotic-assisted bilateral external iliac lymph node biopsy with resection of right pelvic brim peritoneal lesion and extensive enterolysis with primary  small bowel repair. 1/3 positive lymph nodes      Chemotherapy  Gemzar/Avastin          3/16:  Diagnosis:  A: Right pelvic LN:   - 1 out of 3 lymph nodes shows metastatic adenocarcinoma consistent with  metastatic serous carcinoma (1/3)     B: Left pelvic LN:  - One lymph node with metastatic adenocarcinoma consistent with metastatic  serous carcinoma with tumor necrosis and focal extracapsular extension (B2)  (1/1)     C: Right pelvic sidewall, biopsy   - Fibroadipose tissue, fibrovascular tissue, and nerve, with fibrosis, no tumor  seen     6/16: PET/CT:  1. New, intensely FDG avid, enlarged periaortic lymph node (PET 81).   2. New, FDG avid left common iliac node is also seen (PET 60).   3. Multiple FDG avid left external iliac lymph nodes are identified, the most anterior node was previously more prominent.       Completed 6 cycles carboplatin + DOXIL with CR by PET/CT in 12/16        Gynecologic malignancy (CMS-HCC)    11/03/2013 Initial Diagnosis     Gynecologic malignancy           Fallopian tube cancer, carcinoma (CMS-HCC)    11/03/2013 Initial Diagnosis     Transfer for [facility] as inpatient with abdominal distention, CT with omental caking and carcinomatosis. CA 125 2999         11/04/2013 Surgery     XL, TAH, BSO, omentectomy, appy, peritoneal stripping, bilatearl ureterolysis, cholecystectomy by surg onc         11/30/2013 - 04/20/2014 Chemotherapy     Docetaxel/carboplatin x 6. Neutropenia with ANC 0.3 after cycle 1. Recieved neulasta after subsequent cycles.          12/06/2013 Genetics     Foundation One KRAS amplification: Trametinib         04/23/2014 Interval Scan(s)     CT abdomen/Pelvis: Small volume hypoattenuating material along the glallbladder fossa. Residual disease vs. complex fluid from chemotherapy. F/u exam recommended.         04/30/2014 Procedure     IP port removal         08/21/2014 No evidence of disease     normal exam. ca 125, 17.5         01/03/2015 Progression     PET: External iliac nodes, left greater than right, are larger and more FDG avid compared to prior, concerning for malignancy. - Questionable small nodes in the cardiophrenic angles are indeterminate.          01/18/2015 Surgery     Robotic-assisted bilateral external iliac lymph node biopsy with resection of right pelvic brim peritoneal lesion and extensive enterolysis with primary small bowel repair. 1/3 positive lymph nodes         01/23/2015 - 04/30/2015 Chemotherapy     Gemzar/Avastin x 3. Neulasta         04/29/2015 Progression PET: New FDG avid left pelvic and retroperitoneal adenopathy, concerning for progression of disease..New focus of uptake in the left lower quadrant abdominal wall musculature is likely postprocedural.          05/31/2015 - 09/20/2015 Chemotherapy     Doxil/Carboplatin x 6  Neulasta         10/15/2015 Interval Scan(s)     PET: Resolution of FDG avid pelvic adenopathy, consistent with response to treatment. No new sites of recurrent or metastatic disease.  11/2015 - 12/2015 Radiation      cosolidation adiotherapy to the pelvis          02/17/2016 Progression     PET: Interval development of hepatic metastatic foci, multiple metastatic soft tissue implants throughout the peritoneum. These regions include perihepatic, perisplenic, scattered mesenteric, left lower abdominal musculature, and retroperitoneal/portacava         02/21/2016 - 06/02/2016 Chemotherapy     Taxotere/Carbo x 6         06/25/2016 -  Chemotherapy     Zejula (parp) 300mg  daily, added Keytruda 01/01/2017         03/09/2017 Progression     PET: Metastatic disease in a left supraclavicular lymph node, retroperitoneal lymph nodes and abdominal soft tissue implants suspected. New large volume ascites.  - Soft tissue with increased uptake at the cystectomy site, worrisome for local recurrence.         03/23/2017 -  Chemotherapy     Chemotherapy: weekly paclitaxel. Plan for 6 cycles         05/04/2017 -  Chemotherapy     Docetaxel/carboplatin plan 6 cycles         09/16/2017 -  Chemotherapy     Chemotherapy Treatment    Treatment Goal Curative   Line of Treatment [No plan line of treatment]   Plan Name OP OVARIAN DOCETAXEL/CARBOPLATIN   Start Date 11/29/2013   End Date 04/06/2014 (Planned)   Provider Joesphine Bare, NP   Chemotherapy dexamethasone (DECADRON) tablet 12 mg, 12 mg, Oral, Once, 3 of 7 cycles    CARBOplatin (PARAPLATIN) 729.5 mg IVPB, 729.5 mg (100 % of original dose 729.5 mg), Intravenous, Once, 3 of 7 cycles  Dose modification: 729.5 mg (original dose 729.5 mg, Cycle 1)    DOCEtaxel (TAXOTERE) 130.5 mg in sodium chloride 0.9% NON-PVC (NS) 250 mL IVPB, 75 mg/m2 = 130.5 mg, Intravenous, Once, 3 of 7 cycles       Chemotherapy Treatment    Treatment Goal Curative   Line of Treatment [No plan line of treatment]   Plan Name Fallopian Tube OP Taxil/Cisplatin Intraperitoneal   Start Date 02/27/2014 (Planned)   End Date 03/06/2014 (Planned)   Provider Joesphine Bare, NP   Chemotherapy [No matching medication found in this treatment plan]     Chemotherapy Treatment    Treatment Goal Curative   Line of Treatment [No plan line of treatment]   Plan Name OP Fallopian Tube PACLITAXEL/Cisplatin intaperineal and intravenous   Start Date 02/27/2014   End Date 03/27/2014   Provider Joesphine Bare, NP   Chemotherapy dexamethasone (DECADRON) tablet 12 mg, 12 mg, Oral, Once, 2 of 2 cycles    CISplatin (PLATINOL) 126 mg in sodium chloride (NS) 0.9 % 1,000 mL IVPB, 75 mg/m2 = 126 mg (100 % of original dose 75 mg/m2), Intraperitoneal, Once, 2 of 2 cycles  Dose modification: 75 mg/m2 (original dose 75 mg/m2, Cycle 1), 75 mg/m2 (original dose 75 mg/m2, Cycle 1)    PACLitaxel (TAXOL) 227 mg in sodium chloride 0.9% NON-PVC (NS) 250 mL IVPB, 135 mg/m2 = 227 mg (100 % of original dose 135 mg/m2), Intravenous, Once, 2 of 2 cycles  Dose modification: 135 mg/m2 (original dose 60 mg/m2, Cycle 1, Reason: Other (See Comments), Comment: per intraperitoneal protocol.), 60 mg/m2 (original dose 60 mg/m2, Cycle 1, Reason: Other (See Comments)), 60 mg/m2 (original dose 135 mg/m2, Cycle 2, Reason: Other (See Comments))       Chemotherapy Treatment    Treatment  Goal Curative   Line of Treatment [No plan line of treatment]   Plan Name OP OVARIAN GEMCITABINE (21 DAYS)   Start Date 02/19/2015   End Date 06/11/2015 (Planned)   Provider Laurence Aly, MD   Chemotherapy dexamethasone (DECADRON) tablet 8 mg, 8 mg, Oral, Once, 3 of 6 cycles    bevacizumab (AVASTIN) 1,095 mg in sodium chloride (NS) 0.9 % 100 mL IVPB, 15 mg/kg = 1,095 mg (100 % of original dose 15 mg/kg), Intravenous, Once, 3 of 6 cycles  Dose modification: 15 mg/kg (original dose 15 mg/kg, Cycle 1)    gemcitabine (GEMZAR) 1,790.11 mg in sodium chloride (NS) 0.9 % 250 mL IVPB, 1,000 mg/m2 = 1,790.11 mg, Intravenous, Once, 3 of 6 cycles       Chemotherapy Treatment    Treatment Goal Curative   Line of Treatment [No plan line of treatment]   Plan Name OP OVARIAN DOXORUBICIN LIPOSOMAL/CARBOPLATIN   Start Date 05/03/2015   End Date 09/24/2015   Provider Laurence Aly, MD   Chemotherapy dexamethasone (DECADRON) tablet 12 mg, 12 mg, Oral, Once, 6 of 6 cycles    CARBOplatin (PARAPLATIN) 750 mg IVPB, 750 mg (100 % of original dose 750 mg), Intravenous, Once, 6 of 6 cycles  Dose modification:   (original dose 750 mg, Cycle 1),   (original dose 674 mg, Cycle 2),   (original dose 674 mg, Cycle 3), 690 mg (original dose 674 mg, Cycle 6, Reason: Other (See Comments), Comment: based on previous creatinine on 10/16 of .65)    DOXOrubicin liposome (DOXIL) 52 mg in dextrose 5 % 250 mL IVPB, 30 mg/m2 = 52 mg, Intravenous, Once, 6 of 6 cycles       Chemotherapy Treatment    Treatment Goal Control   Line of Treatment [No plan line of treatment]   Plan Name OP OVARIAN DOCETAXEL/CARBOPLATIN   Start Date 02/21/2016   End Date 06/02/2016   Provider Maurie Boettcher, MD   Chemotherapy dexamethasone (DECADRON) tablet 12 mg, 12 mg, Oral, Once, 6 of 6 cycles    CARBOplatin (PARAPLATIN) 750 mg IVPB, 750 mg (112.4 % of original dose 667 mg), Intravenous, Once, 6 of 6 cycles  Dose modification:   (original dose 667 mg, Cycle 1), 750 mg (original dose 667 mg, Cycle 1)    DOCEtaxel (TAXOTERE) 139.5 mg in sodium chloride 0.9% NON-PVC (NS) 250 mL IVPB, 75 mg/m2 = 139.5 mg, Intravenous, Once, 6 of 6 cycles       Chemotherapy Treatment    Treatment Goal Control   Line of Treatment [No plan line of treatment]   Plan Name OP OVARIAN PEMBROLIZUMAB   Start Date 01/01/2017   End Date 06/01/2017 (Planned)   Provider Maurie Boettcher, MD   Chemotherapy pembrolizumab [facility]) 200 mg in sodium chloride (NS) 0.9 % 50 mL IVPB, 200 mg, Intravenous, Once, 3 of 8 cycles       Chemotherapy Treatment    Treatment Goal Control   Line of Treatment [No plan line of treatment]   Plan Name OP OVARIAN PACLITAXEL (WEEKLY)   Start Date 03/23/2017   End Date 07/27/2017 (Planned)   Provider Maurie Boettcher, MD   Chemotherapy dexamethasone (DECADRON) tablet 20 mg, 20 mg, Oral, Once, 2 of 6 cycles    PACLItaxel (TAXOL) 140.82 mg in sodium chloride 0.9% NON-PVC (NS) 250 mL IVPB, 80 mg/m2 = 140.82 mg, Intravenous, Once, 2 of 6 cycles       Chemotherapy Treatment    Treatment  Goal Control   Line of Treatment [No plan line of treatment]   Plan Name OP OVARIAN PACLITAXEL/CARBOPLATIN   Start Date 04/28/2017 (Planned)   End Date 08/11/2017 (Planned)   Provider Maurie Boettcher, MD   Chemotherapy dexamethasone (DECADRON) tablet 12 mg, 12 mg, Oral, Once, 0 of 6 cycles    CARBOplatin (PARAPLATIN) 812.4 mg in sodium chloride (NS) 0.9% 100 mL IVPB, 812.4 mg (100 % of original dose 812.4 mg), Intravenous, Once, 0 of 6 cycles  Dose modification:   (original dose 812.4 mg, Cycle 1), 812.4 mg (original dose 812.4 mg, Cycle 1)    PACLItaxel (TAXOL) 311.52 mg in sodium chloride 0.9% NON-PVC (NS) 500 mL IVPB, 175 mg/m2 = 311.52 mg, Intravenous, Once, 0 of 6 cycles       Chemotherapy Treatment    Treatment Goal Control   Line of Treatment [No plan line of treatment]   Plan Name OP OVARIAN DOCETAXEL/CARBOPLATIN   Start Date 05/04/2017   End Date 07/08/2017   Provider Maurie Boettcher, MD   Chemotherapy dexamethasone (DECADRON) tablet 12 mg, 12 mg, Oral, Once, 4 of 4 cycles    CARBOplatin (PARAPLATIN) 629.5 mg IVPB, 629.5 mg (100 % of original dose 629.5 mg), Intravenous, Once, 4 of 4 cycles  Dose modification:   (original dose 629.5 mg, Cycle 1)    DOCEtaxel (TAXOTERE) 133.5 mg in sodium chloride 0.9% NON-PVC (NS) 250 mL IVPB, 75 mg/m2 = 133.5 mg, Intravenous, Once, 4 of 4 cycles       Chemotherapy Treatment    Treatment Goal Control   Line of Treatment [No plan line of treatment]   Plan Name OP OVARIAN PACLITAXEL/CARBOPLATIN   Start Date 07/28/2017   End Date 08/20/2017   Provider Maurie Boettcher, MD   Chemotherapy dexamethasone (DECADRON) tablet 12 mg, 12 mg, Oral, Once, 2 of 2 cycles    CARBOplatin (PARAPLATIN) 900 mg IVPB, 900 mg (104.1 % of original dose 864.6 mg), Intravenous, Once, 2 of 2 cycles  Dose modification:   (original dose 864.6 mg, Cycle 1)    PACLItaxel (TAXOL) 315 mg in sodium chloride 0.9% NON-PVC (NS) 500 mL IVPB, 175 mg/m2 = 315 mg, Intravenous, Once, 2 of 2 cycles       Chemotherapy Treatment    Treatment Goal Control   Line of Treatment [No plan line of treatment]   Plan Name OP OVARIAN BEVACIZUMAB   Start Date 09/16/2017   End Date 12/28/2017 (Planned)   Provider Maurie Boettcher, MD   Chemotherapy bevacizumab (AVASTIN) 1,056 mg in sodium chloride (NS) 0.9% 100 mL IVPB, 15 mg/kg = 1,056 mg, Intravenous, Once, 3 of 6 cycles                 INTERVAL HISTORY:  Had a great trip to the [place] to visit her son (actually went twice since we saw her). Since day 8 post chemotherapy has felt increasingly fatigued, abdominal pain and bloating. No nausea/vomiting. Still drinking 1 protein shake daily. Daily BM, passing gas. Using her aspira regularly, initially every 3 days removing but most recently used two days in a row and got out 900 ml then 600 ml. Still feels that her abdomen is bigger than usual. Some SOB but able to walk without symptoms. Had a xanax at home that she took which made her feel better. Denies fevers, chills, cough, dysuria or frequency, diarrhea, new leg swelling. Eager for her next cycle of chemotherapy    PAST MEDICAL/SURGICAL HX:  Past Medical History:   Diagnosis Date   ??? Asthma     exercise induced   ??? BRCA negative    ??? Cancer (CMS-HCC)     ovarian   ??? Chemotherapy follow-up examination     IP Chemo   ??? Disease of thyroid gland     hypothyroid   ??? PONV (postoperative nausea and vomiting)        Past Surgical History:   Procedure Laterality Date   ??? CESAREAN SECTION      x3   ??? HYSTERECTOMY     ??? IR INSERT TUNNEL INTRAPERITONEAL CATH  11/23/2017    IR INSERT TUNNEL INTRAPERITONEAL CATH 11/23/2017 Carolin Coy, MD IMG VIR [facility]   ??? PR LAP, SURG ENTEROLYSIS N/A 01/18/2015    Procedure: ROBOTIC LAPAROSCOPY, SURGICAL, ENTEROLYSIS (FREEING OF INTESTINAL ADHESION) (SEPARATE PROCEDURE);  Surgeon: Laurence Aly, MD;  Location: MAIN OR [facility];  Service: Gynecology Oncology   ??? PR OOPH W/RADIC DISSECT FOR DEBULKING Midline 11/04/2013    Procedure: RESECTION (INITIAL) OVARIAN, TUBAL/PRIM PERITONEAL MALIG W/BIL S&O/OMENTECT; W/RAD DISSECTION FOR DEBULKING;  Surgeon: Laurence Aly, MD;  Location: MAIN OR [facility];  Service: Gynecology Oncology   ??? PR REMOVAL GALLBLADDER  11/04/2013    Procedure: CHOLECYSTECTOMY;  Surgeon: Laurence Aly, MD;  Location: MAIN OR [facility];  Service: Gynecology Oncology   ??? PR REMOVAL TUNNELED INTRAPERITONEAL CATHETER N/A 04/30/2014    Procedure: REMOVAL OF PERMANENT INTRAPERITONEAL CANNULA OR CATHETER;  Surgeon: Laurence Aly, MD;  Location: MAIN OR [facility];  Service: Gynecology Oncology   ??? PR REMOVAL, ABDOMEN LYMPH NODE,STAGING Bilateral 01/18/2015    Procedure: ROBOTIC XI LIMITED LYMPHADENECTOMY FOR STAGING (SEPARTE PROCEDURE); RETROPERITONEAL (AORTIC AND/OR SPLENIC);  Surgeon: Laurence Aly, MD;  Location: MAIN OR [facility];  Service: Gynecology Oncology       Family History   Problem Relation Age of Onset   ??? Breast cancer Maternal Aunt 68        currently 14   ??? Cancer Maternal Aunt         breast   ??? Anesthesia problems Neg Hx    ??? Thyroid disease Neg Hx    ??? Hypertension Neg Hx    ??? Diabetes Neg Hx        Social History     Social History   ??? Marital status: Married     Spouse name: N/A   ??? Number of children: N/A   ??? Years of education: N/A     Social History Main Topics   ??? Smoking status: Never Smoker   ??? Smokeless tobacco: Never Used   ??? Alcohol use No      Comment: occasional   ??? Drug use: No   ??? Sexual activity: Not on file     Other Topics Concern   ??? Not on file     Social History Narrative   ??? No narrative on file       CURRENT MEDICATIONS:    Current Outpatient Prescriptions:   ???  acyclovir (ZOVIRAX) 400 MG tablet, Take two tablets bid for 3 days when outbreak occurs, Disp: , Rfl:   ???  ALPHA LIPOIC ACID ORAL, Take by mouth daily. Unknown dose, Disp: , Rfl:   ???  calcium carbonate-vitamin D3 600mg  (1,000mg ) -1,000 unit Tab, Take by mouth., Disp: , Rfl:   ???  cyclophosphamide (CYTOXAN) 50 mg capsule, Take 1 capsule (50 mg total) by mouth daily. (Patient not taking: Reported on 11/23/2017), Disp: 30  capsule, Rfl: 5  ???  dexamethasone (DECADRON) 4 MG tablet, Take 4 mg of dexamethasone orally, twice daily, for 3 days beginning the day before treatment., Disp: 120 tablet, Rfl: 0  ???  docusate sodium (COLACE) 100 MG capsule, Take 1 capsule (100 mg total) by mouth Two (2) times a day., Disp: 60 capsule, Rfl: 0  ???  etoposide (VEPESID) 50 mg capsule, Take 1 capsule (50 mg total) by mouth daily. For 3 weeks then 1 week off therapy before starting the next cycle., Disp: 21 capsule, Rfl: 5  ???  famotidine (PEPCID) 40 MG tablet, Take 1 tablet (40 mg total) by mouth every evening. (Patient not taking: Reported on 11/23/2017), Disp: 30 tablet, Rfl: 3  ???  FLUARIX vaccine, quad syringe (36 MOS UP)(PF) 2017-18, TO BE ADMINISTERED BY PHARMACIST FOR IMMUNIZATION, Disp: , Rfl: 0  ???  furosemide (LASIX) 20 MG tablet, Take 1 tablet (20 mg total) by mouth daily. (Patient not taking: Reported on 11/23/2017), Disp: 30 tablet, Rfl: 4  ???  GLUCOSAM SUL NA/CHONDR SU A NA (GLUCOSAMINE & CHONDROIT SUL.NA ORAL), Take by mouth., Disp: , Rfl:   ???  levothyroxine (SYNTHROID, LEVOTHROID) 125 MCG tablet, Take 1 tablet (125 mcg total) by mouth daily at 0600., Disp: 90 tablet, Rfl: 3  ???  LORazepam (ATIVAN) 0.5 MG tablet, Take 1 tablet (0.5 mg total) by mouth every six (6) hours as needed for anxiety., Disp: 20 tablet, Rfl: 0  ???  metoclopramide (REGLAN) 5 MG tablet, Take 1 tablet (5 mg total) by mouth Three (3) times a day., Disp: 90 tablet, Rfl: 2  ???  multivitamin (THERAGRAN) per tablet, Take by mouth., Disp: , Rfl:   ???  ondansetron (ZOFRAN) 8 MG tablet, Take 1 tablet (8 mg total) by mouth every eight (8) hours as needed for nausea., Disp: 30 tablet, Rfl: 5  ???  oxyCODONE (ROXICODONE) 5 MG immediate release tablet, Take 1 tablet (5 mg total) by mouth every eight (8) hours as needed for pain (every 4-6 hours as needed)., Disp: 30 tablet, Rfl: 0  ???  prochlorperazine (COMPAZINE) 10 MG tablet, Take 1 tablet (10 mg total) by mouth every six (6) hours as needed (Nausea/Vomiting)., Disp: 30 tablet, Rfl: 2  ???  pyridoxine, vitamin B6, (VITAMIN B-6) 100 MG tablet, Take 100 mg by mouth daily., Disp: , Rfl:   ???  UNABLE TO FIND, Med Name: IP6 - Inositol Hexaphosphate Takes by Mouth daily; unknown dose, Disp: , Rfl:   No current facility-administered medications for this visit.     Facility-Administered Medications Ordered in Other Visits:   ???  OKAY TO SEND MEDICATION/CHEMOTHERAPY TO UNIT, , Other, Once, Maurie Boettcher, MD  ???  sodium chloride (NS) 0.9 % infusion, 100 mL/hr, Intravenous, Continuous, Maurie Boettcher, MD, Stopped at 03/13/16 1200    REVIEW OF SYSTEMS:  Complete 10-system review is negative except as above in Interval History.    PHYSICAL EXAM:  BP 127/89  - Pulse 108  - Temp 36.4 ??C (97.5 ??F) (Oral)  - Wt 67 kg (147 lb 9.6 oz)  - LMP 10/23/2013  - SpO2 99%  - BMI 27.09 kg/m??   General: Alert, oriented, no acute distress.  HEENT: Posterior oropharynx clear, sclera anicteric.  Chest: Decreased breath sounds at the bases  Cardiovascular: Regular rate and rhythm, no murmurs.  Abdomen: Soft, nontender. Minimally distended. Normoactive bowel sounds.  No masses or hepatosplenomegaly appreciated. Aspira site c/d/i Extremities: Grossly normal range of motion.  Warm, well perfused.  No edema bilaterally.  Skin: No rashes or lesions noted.    BSUS minimal free fluid    LABORATORY AND RADIOLOGIC STUDIES:  WBC 8.9, Hgb 12, Plt 661, Mg 1.8, Alb 2.7, Cr 0.98, 2+ neutrophil left shift

## 2018-01-27 ENCOUNTER — Encounter: Admit: 2018-01-27 | Discharge: 2018-01-27 | Payer: PRIVATE HEALTH INSURANCE

## 2018-01-27 ENCOUNTER — Ambulatory Visit: Admit: 2018-01-27 | Discharge: 2018-01-27 | Payer: PRIVATE HEALTH INSURANCE

## 2018-01-27 DIAGNOSIS — C5700 Malignant neoplasm of unspecified fallopian tube: Principal | ICD-10-CM

## 2018-01-27 DIAGNOSIS — Z9889 Other specified postprocedural states: Secondary | ICD-10-CM

## 2018-01-27 LAB — CBC W/ AUTO DIFF
BASOPHILS ABSOLUTE COUNT: 0 10*9/L (ref 0.0–0.1)
BASOPHILS RELATIVE PERCENT: 0.1 %
EOSINOPHILS ABSOLUTE COUNT: 0 10*9/L (ref 0.0–0.4)
EOSINOPHILS RELATIVE PERCENT: 0.2 %
HEMATOCRIT: 34.6 % — ABNORMAL LOW (ref 36.0–46.0)
LARGE UNSTAINED CELLS: 0 % (ref 0–4)
LYMPHOCYTES ABSOLUTE COUNT: 0.5 10*9/L — ABNORMAL LOW (ref 1.5–5.0)
LYMPHOCYTES RELATIVE PERCENT: 3.6 %
MEAN CORPUSCULAR HEMOGLOBIN CONC: 32.4 g/dL (ref 31.0–37.0)
MEAN CORPUSCULAR HEMOGLOBIN: 31.4 pg (ref 26.0–34.0)
MEAN CORPUSCULAR VOLUME: 96.9 fL (ref 80.0–100.0)
MONOCYTES ABSOLUTE COUNT: 0.5 10*9/L (ref 0.2–0.8)
MONOCYTES RELATIVE PERCENT: 3.2 %
NEUTROPHILS ABSOLUTE COUNT: 13.5 10*9/L — ABNORMAL HIGH (ref 2.0–7.5)
NEUTROPHILS RELATIVE PERCENT: 92.6 %
PLATELET COUNT: 553 10*9/L — ABNORMAL HIGH (ref 150–440)
RED BLOOD CELL COUNT: 3.57 10*12/L — ABNORMAL LOW (ref 4.00–5.20)
RED CELL DISTRIBUTION WIDTH: 16.7 % — ABNORMAL HIGH (ref 12.0–15.0)
WBC ADJUSTED: 14.6 10*9/L — ABNORMAL HIGH (ref 4.5–11.0)

## 2018-01-27 LAB — COMPREHENSIVE METABOLIC PANEL
ALBUMIN: 2.7 g/dL — ABNORMAL LOW (ref 3.5–5.0)
ALKALINE PHOSPHATASE: 81 U/L (ref 38–126)
ALT (SGPT): 43 U/L (ref 15–48)
ANION GAP: 10 mmol/L (ref 9–15)
AST (SGOT): 36 U/L (ref 14–38)
BILIRUBIN TOTAL: 0.1 mg/dL (ref 0.0–1.2)
BLOOD UREA NITROGEN: 18 mg/dL (ref 7–21)
BUN / CREAT RATIO: 21
CALCIUM: 9.1 mg/dL (ref 8.5–10.2)
CHLORIDE: 96 mmol/L — ABNORMAL LOW (ref 98–107)
CO2: 29 mmol/L (ref 22.0–30.0)
CREATININE: 0.84 mg/dL (ref 0.60–1.00)
EGFR MDRD AF AMER: >=60 mL/min/{1.73_m2} (ref >=60)
EGFR MDRD NON AF AMER: >=60 mL/min/{1.73_m2} (ref >=60)
POTASSIUM: 4.9 mmol/L (ref 3.5–5.0)
PROTEIN TOTAL: 4.9 g/dL — ABNORMAL LOW (ref 6.5–8.3)
SODIUM: 135 mmol/L (ref 135–145)

## 2018-01-27 LAB — PROTEIN TOTAL: Protein:MCnc:Pt:Ser/Plas:Qn:: 4.9 — ABNORMAL LOW

## 2018-01-27 LAB — RED BLOOD CELL COUNT: Lab: 3.57 — ABNORMAL LOW

## 2018-01-27 LAB — CA 125: Cancer Ag 125:ACnc:Pt:Ser/Plas:Qn:: 5050 — ABNORMAL HIGH

## 2018-01-27 LAB — MAGNESIUM: Magnesium:MCnc:Pt:Ser/Plas:Qn:: 1.3 — ABNORMAL LOW

## 2018-02-07 ENCOUNTER — Ambulatory Visit
Admit: 2018-02-07 | Discharge: 2018-02-11 | Disposition: A | Discharge status: Home / Self Care | Payer: PRIVATE HEALTH INSURANCE

## 2018-02-08 DIAGNOSIS — K219 Gastro-esophageal reflux disease without esophagitis: Principal | ICD-10-CM

## 2018-02-10 MED ORDER — METOCLOPRAMIDE 5 MG TABLET
ORAL_TABLET | Freq: Three times a day (TID) | ORAL | 1 refills | 0.00000 days | Status: CP
Start: 2018-02-10 — End: 2018-03-12

## 2018-02-10 MED ORDER — FAMOTIDINE 20 MG TABLET
ORAL_TABLET | Freq: Two times a day (BID) | ORAL | 1 refills | 0.00000 days | Status: CP
Start: 2018-02-10 — End: 2018-03-12

## 2018-02-10 MED ORDER — SENNOSIDES 8.6 MG TABLET
ORAL_TABLET | Freq: Every evening | ORAL | 1 refills | 0.00000 days | Status: CP
Start: 2018-02-10 — End: 2018-03-12

## 2018-02-11 MED ORDER — ONDANSETRON 8 MG DISINTEGRATING TABLET
ORAL_TABLET | Freq: Three times a day (TID) | ORAL | 1 refills | 0.00000 days | Status: CP
Start: 2018-02-11 — End: 2018-02-18

## 2018-02-11 MED ORDER — PANTOPRAZOLE 40 MG TABLET,DELAYED RELEASE
ORAL_TABLET | Freq: Every day | ORAL | 1 refills | 0.00000 days | Status: CP
Start: 2018-02-11 — End: 2018-03-13

## 2018-03-09 DEATH — deceased
